# Patient Record
Sex: Male | Born: 1955 | Race: White | Hispanic: No | Smoking: Current every day smoker
Health system: Southern US, Community
[De-identification: ages and names within clinical notes are randomized; demographics above are authoritative.]

## PROBLEM LIST (undated history)

## (undated) DIAGNOSIS — K439 Ventral hernia without obstruction or gangrene: Secondary | ICD-10-CM

---

## 2007-06-24 ENCOUNTER — Ambulatory Visit (HOSPITAL_COMMUNITY): Admission: RE | Admit: 2007-06-24 | Discharge: 2007-06-24 | Payer: Self-pay | Admitting: General Surgery

## 2010-07-24 NOTE — Op Note (Signed)
Juan Logan, Juan Logan                  ACCOUNT NO.:  [id number]   MEDICAL RECORD NO.:  [id number]          PATIENT TYPE:  AMB   LOCATION:  DAY                          FACILITY:  [facility]   PHYSICIAN:  Leonie Man, M.D.   DATE OF BIRTH:  January 14, 1956   DATE OF PROCEDURE:  06/24/2007  DATE OF DISCHARGE:                               OPERATIVE REPORT   PREOPERATIVE DIAGNOSIS:  Incarcerated epigastric ventral hernia.   POSTOPERATIVE DIAGNOSIS:  Incarcerated epigastric ventral hernia.   PROCEDURE:  Repair of epigastric ventral hernia.   SURGEON:  Leonie Man, M.D.   ASSISTANT:  OR tech.   ANESTHESIA:  General.   The patient is a 55 year old man presenting with a painful incarcerated  epigastric hernia just superior to the umbilicus.  He comes to the  operating room now for repair after the risks and potential benefits of  surgery have been discussed.  All questions answered, consent obtained.   Following the induction of satisfactory general anesthesia, the patient  is positioned supinely and the abdomen is prepped and draped to be  included in a sterile operative field.  Positive identification of the  patient as Juan Logan and the procedure to be done as repair of  epigastric ventral hernia was done.  I made a midline incision extending  from the edge of the umbilicus superiorly into the epigastrium,  deepening this through skin, subcutaneous tissue and dissecting down to  the hernia sac.  The hernia sac is grasped and dissected free on all  sides and the incarcerated round ligament is then isolated, clamped and  suture ligated with 2-0 silk.  The edges of the hernia sac are cleared.  This being a relatively small defect, approximately 2.5 cm to 3 in  length, I elected to close this primarily in a transverse fashion using  0 Novofil sutures.  Interrupted 0 Novofil placed to completely close the  hernial defect.  All areas of dissection checked for hemostasis and  noted to be dry.   Sponge, instrument and sharp counts were verified.  Subcutaneous tissues closed with interrupted 2-0 Vicryl sutures.  The  skin closed with running 4-0 Monocryl suture reinforced with Steri-  Strips.  Sterile dressings applied.  The anesthetic reversed and the  patient removed from the operating room to the recovery room in stable  condition.  He tolerated the procedure well.      Leonie Man, M.D.  Electronically Signed     PB/MEDQ  D:  06/24/2007  T:  06/24/2007  Job:  [id number]

## 2010-12-04 LAB — PROTIME-INR
INR: 1
Prothrombin Time: 13.4

## 2010-12-04 LAB — CBC
Hemoglobin: 14.7
MCHC: 32.7
MCV: 89.6
RBC: 5.02
RDW: 13.1

## 2010-12-04 LAB — DIFFERENTIAL
Lymphocytes Relative: 40
Lymphs Abs: 2.1
Neutro Abs: 2.6
Neutrophils Relative %: 48

## 2010-12-04 LAB — COMPREHENSIVE METABOLIC PANEL
CO2: 29
Calcium: 9.1
Creatinine, Ser: 0.95
GFR calc non Af Amer: >60
Glucose, Bld: 105 — ABNORMAL HIGH

## 2012-03-31 ENCOUNTER — Other Ambulatory Visit: Payer: Self-pay | Admitting: Ophthalmology

## 2012-04-04 LAB — WOUND AEROBIC CULTURE

## 2012-04-21 LAB — CULTURE, FUNGUS WITHOUT SMEAR

## 2019-10-15 ENCOUNTER — Encounter (HOSPITAL_COMMUNITY): Payer: Self-pay | Admitting: Neurology

## 2019-10-15 ENCOUNTER — Other Ambulatory Visit: Payer: Self-pay

## 2019-10-15 ENCOUNTER — Inpatient Hospital Stay (HOSPITAL_COMMUNITY)
Admission: EM | Admit: 2019-10-15 | Discharge: 2019-10-17 | DRG: 023 | Disposition: A | Discharge status: Home / Self Care | Payer: Medicare PPO | Attending: Neurology | Admitting: Neurology

## 2019-10-15 ENCOUNTER — Emergency Department (HOSPITAL_COMMUNITY): Payer: Medicare PPO | Admitting: Certified Registered Nurse Anesthetist

## 2019-10-15 ENCOUNTER — Encounter (HOSPITAL_COMMUNITY)
Admission: EM | Disposition: A | Discharge status: Home / Self Care | Payer: Self-pay | Source: Home / Self Care | Attending: Neurology

## 2019-10-15 ENCOUNTER — Inpatient Hospital Stay (HOSPITAL_COMMUNITY): Payer: Medicare PPO

## 2019-10-15 ENCOUNTER — Emergency Department (HOSPITAL_COMMUNITY): Payer: Medicare PPO

## 2019-10-15 DIAGNOSIS — F17200 Nicotine dependence, unspecified, uncomplicated: Secondary | ICD-10-CM | POA: Diagnosis not present

## 2019-10-15 DIAGNOSIS — I10 Essential (primary) hypertension: Secondary | ICD-10-CM | POA: Diagnosis present

## 2019-10-15 DIAGNOSIS — F17210 Nicotine dependence, cigarettes, uncomplicated: Secondary | ICD-10-CM | POA: Diagnosis present

## 2019-10-15 DIAGNOSIS — R4701 Aphasia: Secondary | ICD-10-CM | POA: Diagnosis present

## 2019-10-15 DIAGNOSIS — I63512 Cerebral infarction due to unspecified occlusion or stenosis of left middle cerebral artery: Secondary | ICD-10-CM | POA: Diagnosis present

## 2019-10-15 DIAGNOSIS — I609 Nontraumatic subarachnoid hemorrhage, unspecified: Secondary | ICD-10-CM | POA: Diagnosis present

## 2019-10-15 DIAGNOSIS — K439 Ventral hernia without obstruction or gangrene: Secondary | ICD-10-CM | POA: Diagnosis present

## 2019-10-15 DIAGNOSIS — E876 Hypokalemia: Secondary | ICD-10-CM | POA: Diagnosis present

## 2019-10-15 DIAGNOSIS — E785 Hyperlipidemia, unspecified: Secondary | ICD-10-CM | POA: Diagnosis present

## 2019-10-15 DIAGNOSIS — I6522 Occlusion and stenosis of left carotid artery: Secondary | ICD-10-CM | POA: Diagnosis present

## 2019-10-15 DIAGNOSIS — I639 Cerebral infarction, unspecified: Secondary | ICD-10-CM | POA: Diagnosis present

## 2019-10-15 DIAGNOSIS — E7800 Pure hypercholesterolemia, unspecified: Secondary | ICD-10-CM | POA: Diagnosis not present

## 2019-10-15 DIAGNOSIS — R29706 NIHSS score 6: Secondary | ICD-10-CM | POA: Diagnosis present

## 2019-10-15 DIAGNOSIS — Z79899 Other long term (current) drug therapy: Secondary | ICD-10-CM

## 2019-10-15 DIAGNOSIS — Z20822 Contact with and (suspected) exposure to covid-19: Secondary | ICD-10-CM | POA: Diagnosis present

## 2019-10-15 DIAGNOSIS — Z8249 Family history of ischemic heart disease and other diseases of the circulatory system: Secondary | ICD-10-CM | POA: Diagnosis not present

## 2019-10-15 DIAGNOSIS — I6389 Other cerebral infarction: Secondary | ICD-10-CM | POA: Diagnosis not present

## 2019-10-15 DIAGNOSIS — I7771 Dissection of carotid artery: Secondary | ICD-10-CM | POA: Diagnosis not present

## 2019-10-15 DIAGNOSIS — Z8673 Personal history of transient ischemic attack (TIA), and cerebral infarction without residual deficits: Secondary | ICD-10-CM | POA: Diagnosis present

## 2019-10-15 DIAGNOSIS — I63232 Cerebral infarction due to unspecified occlusion or stenosis of left carotid arteries: Secondary | ICD-10-CM | POA: Diagnosis not present

## 2019-10-15 HISTORY — PX: RADIOLOGY WITH ANESTHESIA: SHX6223

## 2019-10-15 HISTORY — PX: IR CT HEAD LTD: IMG2386

## 2019-10-15 HISTORY — PX: IR INTRAVSC STENT CERV CAROTID W/EMB-PROT MOD SED INCL ANGIO: IMG2303

## 2019-10-15 HISTORY — PX: IR PERCUTANEOUS ART THROMBECTOMY/INFUSION INTRACRANIAL INC DIAG ANGIO: IMG6087

## 2019-10-15 HISTORY — DX: Ventral hernia without obstruction or gangrene: K43.9

## 2019-10-15 LAB — COMPREHENSIVE METABOLIC PANEL
ALT: 13 U/L (ref 0–44)
AST: 15 U/L (ref 15–41)
Albumin: 3.5 g/dL (ref 3.5–5.0)
Alkaline Phosphatase: 66 U/L (ref 38–126)
Anion gap: 11 (ref 5–15)
BUN: 8 mg/dL (ref 8–23)
CO2: 22 mmol/L (ref 22–32)
Calcium: 9 mg/dL (ref 8.9–10.3)
Chloride: 106 mmol/L (ref 98–111)
Creatinine, Ser: 1.09 mg/dL (ref 0.61–1.24)
GFR calc Af Amer: >60 mL/min (ref >60)
GFR calc non Af Amer: >60 mL/min (ref >60)
Glucose, Bld: 135 mg/dL — ABNORMAL HIGH (ref 70–99)
Potassium: 3.9 mmol/L (ref 3.5–5.1)
Sodium: 139 mmol/L (ref 135–145)
Total Bilirubin: 0.5 mg/dL (ref 0.3–1.2)
Total Protein: 7.4 g/dL (ref 6.5–8.1)

## 2019-10-15 LAB — I-STAT CHEM 8, ED
BUN: 8 mg/dL (ref 8–23)
Calcium, Ion: 1.1 mmol/L — ABNORMAL LOW (ref 1.15–1.40)
Chloride: 104 mmol/L (ref 98–111)
Creatinine, Ser: 1 mg/dL (ref 0.61–1.24)
Glucose, Bld: 133 mg/dL — ABNORMAL HIGH (ref 70–99)
HCT: 46 % (ref 39.0–52.0)
Hemoglobin: 15.6 g/dL (ref 13.0–17.0)
Potassium: 3.7 mmol/L (ref 3.5–5.1)
Sodium: 141 mmol/L (ref 135–145)
TCO2: 21 mmol/L — ABNORMAL LOW (ref 22–32)

## 2019-10-15 LAB — PROTIME-INR
INR: 1.1 (ref 0.8–1.2)
Prothrombin Time: 13.5 seconds (ref 11.4–15.2)

## 2019-10-15 LAB — APTT: aPTT: 37 seconds — ABNORMAL HIGH (ref 24–36)

## 2019-10-15 LAB — URINALYSIS, ROUTINE W REFLEX MICROSCOPIC
Bilirubin Urine: NEGATIVE
Glucose, UA: NEGATIVE mg/dL
Hgb urine dipstick: NEGATIVE
Ketones, ur: NEGATIVE mg/dL
Leukocytes,Ua: NEGATIVE
Nitrite: NEGATIVE
Protein, ur: NEGATIVE mg/dL
Specific Gravity, Urine: 1.041 — ABNORMAL HIGH (ref 1.005–1.030)
pH: 6 (ref 5.0–8.0)

## 2019-10-15 LAB — DIFFERENTIAL
Abs Immature Granulocytes: 0.02 10*3/uL (ref 0.00–0.07)
Basophils Absolute: 0.1 10*3/uL (ref 0.0–0.1)
Basophils Relative: 1 %
Eosinophils Absolute: 0.1 10*3/uL (ref 0.0–0.5)
Eosinophils Relative: 1 %
Immature Granulocytes: 0 %
Lymphocytes Relative: 25 %
Lymphs Abs: 1.7 10*3/uL (ref 0.7–4.0)
Monocytes Absolute: 0.4 10*3/uL (ref 0.1–1.0)
Monocytes Relative: 6 %
Neutro Abs: 4.3 10*3/uL (ref 1.7–7.7)
Neutrophils Relative %: 67 %

## 2019-10-15 LAB — SARS CORONAVIRUS 2 BY RT PCR (HOSPITAL ORDER, PERFORMED IN ~~LOC~~ HOSPITAL LAB): SARS Coronavirus 2: NEGATIVE

## 2019-10-15 LAB — CBC
HCT: 45.6 % (ref 39.0–52.0)
Hemoglobin: 15.1 g/dL (ref 13.0–17.0)
MCH: 30.3 pg (ref 26.0–34.0)
MCHC: 33.1 g/dL (ref 30.0–36.0)
MCV: 91.6 fL (ref 80.0–100.0)
Platelets: 188 10*3/uL (ref 150–400)
RBC: 4.98 MIL/uL (ref 4.22–5.81)
RDW: 12.6 % (ref 11.5–15.5)
WBC: 6.6 10*3/uL (ref 4.0–10.5)
nRBC: 0 % (ref 0.0–0.2)

## 2019-10-15 LAB — ETHANOL: Alcohol, Ethyl (B): <10 mg/dL (ref <10)

## 2019-10-15 LAB — RAPID URINE DRUG SCREEN, HOSP PERFORMED
Amphetamines: NOT DETECTED
Barbiturates: NOT DETECTED
Benzodiazepines: NOT DETECTED
Cocaine: NOT DETECTED
Opiates: NOT DETECTED
Tetrahydrocannabinol: NOT DETECTED

## 2019-10-15 LAB — MRSA PCR SCREENING: MRSA by PCR: POSITIVE — AB

## 2019-10-15 SURGERY — IR WITH ANESTHESIA
Anesthesia: General

## 2019-10-15 MED ORDER — IOHEXOL 300 MG/ML  SOLN: 50.0000 mL | Freq: Once | INTRAMUSCULAR | Status: DC | PRN

## 2019-10-15 MED ORDER — ACETAMINOPHEN 160 MG/5ML PO SOLN: 650.0000 mg | ORAL | Status: DC | PRN

## 2019-10-15 MED ORDER — MUPIROCIN 2 % EX OINT
1.0000 "application " | TOPICAL_OINTMENT | Freq: Two times a day (BID) | CUTANEOUS | Status: DC
  Administered 2019-10-16 – 2019-10-17 (×3): 1 via NASAL
  Filled 2019-10-15: qty 22

## 2019-10-15 MED ORDER — VERAPAMIL HCL 2.5 MG/ML IV SOLN
INTRAVENOUS | Status: AC
  Filled 2019-10-15: qty 4

## 2019-10-15 MED ORDER — LACTATED RINGERS IV SOLN: INTRAVENOUS | Status: DC | PRN

## 2019-10-15 MED ORDER — CANGRELOR TETRASODIUM 50 MG IV SOLR
INTRAVENOUS | Status: AC
  Filled 2019-10-15: qty 50

## 2019-10-15 MED ORDER — CHLORHEXIDINE GLUCONATE CLOTH 2 % EX PADS
6.0000 | MEDICATED_PAD | Freq: Every day | CUTANEOUS | Status: DC
  Administered 2019-10-15: 6 via TOPICAL

## 2019-10-15 MED ORDER — ASPIRIN EC 325 MG PO TBEC
325.0000 mg | DELAYED_RELEASE_TABLET | Freq: Once | ORAL | Status: AC
  Administered 2019-10-15: 325 mg via ORAL
  Filled 2019-10-15: qty 1

## 2019-10-15 MED ORDER — EPTIFIBATIDE 20 MG/10ML IV SOLN
INTRAVENOUS | Status: AC
  Filled 2019-10-15: qty 10

## 2019-10-15 MED ORDER — ACETAMINOPHEN 325 MG PO TABS
650.0000 mg | ORAL_TABLET | ORAL | Status: DC | PRN
  Administered 2019-10-15: 650 mg via ORAL
  Filled 2019-10-15: qty 2

## 2019-10-15 MED ORDER — ACETAMINOPHEN 650 MG RE SUPP: 650.0000 mg | RECTAL | Status: DC | PRN

## 2019-10-15 MED ORDER — SUGAMMADEX SODIUM 200 MG/2ML IV SOLN
INTRAVENOUS | Status: DC | PRN
  Administered 2019-10-15: 100 mg via INTRAVENOUS
  Administered 2019-10-15: 200 mg via INTRAVENOUS

## 2019-10-15 MED ORDER — SODIUM CHLORIDE 0.9 % IV SOLN: INTRAVENOUS | Status: DC

## 2019-10-15 MED ORDER — CLEVIDIPINE BUTYRATE 0.5 MG/ML IV EMUL
0.0000 mg/h | INTRAVENOUS | Status: DC
  Administered 2019-10-15: 1 mg/h via INTRAVENOUS
  Administered 2019-10-16: 2 mg/h via INTRAVENOUS
  Administered 2019-10-16: 1 mg/h via INTRAVENOUS
  Filled 2019-10-15 (×3): qty 50

## 2019-10-15 MED ORDER — ESMOLOL HCL 100 MG/10ML IV SOLN
INTRAVENOUS | Status: DC | PRN
Start: 2019-10-15 — End: 2019-10-15
  Administered 2019-10-15: 30 mg via INTRAVENOUS
  Administered 2019-10-15: 40 mg via INTRAVENOUS
  Administered 2019-10-15: 30 mg via INTRAVENOUS

## 2019-10-15 MED ORDER — TIROFIBAN HCL IN NACL 5-0.9 MG/100ML-% IV SOLN
INTRAVENOUS | Status: AC
  Filled 2019-10-15: qty 100

## 2019-10-15 MED ORDER — IOHEXOL 350 MG/ML SOLN
100.0000 mL | Freq: Once | INTRAVENOUS | Status: AC | PRN
  Administered 2019-10-15: 100 mL via INTRAVENOUS

## 2019-10-15 MED ORDER — LIDOCAINE 2% (20 MG/ML) 5 ML SYRINGE
INTRAMUSCULAR | Status: DC | PRN
  Administered 2019-10-15: 100 mg via INTRAVENOUS

## 2019-10-15 MED ORDER — ASPIRIN 81 MG PO CHEW
CHEWABLE_TABLET | ORAL | Status: AC
  Filled 2019-10-15: qty 1

## 2019-10-15 MED ORDER — IOHEXOL 240 MG/ML SOLN
INTRAMUSCULAR | Status: AC
  Filled 2019-10-15: qty 200

## 2019-10-15 MED ORDER — CANGRELOR BOLUS VIA INFUSION
INTRAVENOUS | Status: DC | PRN
  Administered 2019-10-15: 1734 ug via INTRAVENOUS

## 2019-10-15 MED ORDER — CLOPIDOGREL BISULFATE 300 MG PO TABS
ORAL_TABLET | ORAL | Status: AC
  Filled 2019-10-15: qty 1

## 2019-10-15 MED ORDER — SENNOSIDES-DOCUSATE SODIUM 8.6-50 MG PO TABS: 1.0000 | ORAL_TABLET | Freq: Every evening | ORAL | Status: DC | PRN

## 2019-10-15 MED ORDER — SODIUM CHLORIDE 0.9 % IV SOLN
INTRAVENOUS | Status: DC | PRN
  Administered 2019-10-15: 2 ug/kg/min via INTRAVENOUS

## 2019-10-15 MED ORDER — CHLORHEXIDINE GLUCONATE CLOTH 2 % EX PADS
6.0000 | MEDICATED_PAD | Freq: Every day | CUTANEOUS | Status: DC
  Administered 2019-10-16: 6 via TOPICAL

## 2019-10-15 MED ORDER — NITROGLYCERIN 1 MG/10 ML FOR IR/CATH LAB
INTRA_ARTERIAL | Status: AC
  Filled 2019-10-15: qty 10

## 2019-10-15 MED ORDER — SUCCINYLCHOLINE CHLORIDE 200 MG/10ML IV SOSY
PREFILLED_SYRINGE | INTRAVENOUS | Status: DC | PRN
  Administered 2019-10-15: 100 mg via INTRAVENOUS

## 2019-10-15 MED ORDER — ONDANSETRON HCL 4 MG/2ML IJ SOLN
INTRAMUSCULAR | Status: DC | PRN
  Administered 2019-10-15: 4 mg via INTRAVENOUS

## 2019-10-15 MED ORDER — ONDANSETRON HCL 4 MG/2ML IJ SOLN
INTRAMUSCULAR | Status: AC
  Administered 2019-10-15: 4 mg
  Filled 2019-10-15: qty 2

## 2019-10-15 MED ORDER — STROKE: EARLY STAGES OF RECOVERY BOOK
Freq: Once | Status: AC
  Administered 2019-10-16: 1
  Filled 2019-10-15: qty 1

## 2019-10-15 MED ORDER — ONDANSETRON HCL 4 MG/2ML IJ SOLN
4.0000 mg | Freq: Four times a day (QID) | INTRAMUSCULAR | Status: DC | PRN
  Administered 2019-10-16: 4 mg via INTRAVENOUS
  Filled 2019-10-15: qty 2

## 2019-10-15 MED ORDER — VERAPAMIL HCL 2.5 MG/ML IV SOLN
INTRAVENOUS | Status: AC
  Filled 2019-10-15: qty 2

## 2019-10-15 MED ORDER — EPHEDRINE SULFATE-NACL 50-0.9 MG/10ML-% IV SOSY
PREFILLED_SYRINGE | INTRAVENOUS | Status: DC | PRN
  Administered 2019-10-15: 5 mg via INTRAVENOUS
  Administered 2019-10-15 (×6): 10 mg via INTRAVENOUS
  Administered 2019-10-15: 5 mg via INTRAVENOUS

## 2019-10-15 MED ORDER — METOCLOPRAMIDE HCL 5 MG/ML IJ SOLN
10.0000 mg | Freq: Once | INTRAMUSCULAR | Status: AC
  Administered 2019-10-15: 10 mg via INTRAVENOUS
  Filled 2019-10-15: qty 2

## 2019-10-15 MED ORDER — ROCURONIUM BROMIDE 10 MG/ML (PF) SYRINGE
PREFILLED_SYRINGE | INTRAVENOUS | Status: DC | PRN
  Administered 2019-10-15: 20 mg via INTRAVENOUS
  Administered 2019-10-15: 60 mg via INTRAVENOUS

## 2019-10-15 MED ORDER — TICAGRELOR 90 MG PO TABS
ORAL_TABLET | ORAL | Status: AC
  Filled 2019-10-15: qty 2

## 2019-10-15 MED ORDER — TICAGRELOR 90 MG PO TABS
180.0000 mg | ORAL_TABLET | Freq: Once | ORAL | Status: AC
  Administered 2019-10-15: 180 mg via ORAL
  Filled 2019-10-15: qty 2

## 2019-10-15 MED ORDER — PROPOFOL 10 MG/ML IV BOLUS
INTRAVENOUS | Status: DC | PRN
  Administered 2019-10-15: 160 mg via INTRAVENOUS
  Administered 2019-10-15: 40 mg via INTRAVENOUS

## 2019-10-15 NOTE — Sedation Documentation (Addendum)
Sheath removed and closed with Perclose 6 fr.  QuikClot applied to groin site with tegaderm.

## 2019-10-15 NOTE — Anesthesia Preprocedure Evaluation (Signed)
Anesthesia Evaluation  Patient identified by MRN, date of birth, ID band Patient awake    Reviewed: Allergy & PrecautionsPreop documentation limited or incomplete due to emergent nature of procedure.  History of Anesthesia Complications Negative for: history of anesthetic complications  Airway Mallampati: II  TM Distance: >3 FB Neck ROM: Full    Dental  (+) Poor Dentition   Pulmonary neg pulmonary ROS,    Pulmonary exam normal        Cardiovascular negative cardio ROS Normal cardiovascular exam     Neuro/Psych CVA    GI/Hepatic   Endo/Other    Renal/GU      Musculoskeletal   Abdominal   Peds  Hematology   Anesthesia Other Findings   Reproductive/Obstetrics                             Anesthesia Physical Anesthesia Plan  ASA: IV and emergent  Anesthesia Plan: General   Post-op Pain Management:    Induction: Intravenous  PONV Risk Score and Plan: 2 and Ondansetron, Dexamethasone, Treatment may vary due to age or medical condition and Midazolam  Airway Management Planned: Oral ETT  Additional Equipment:   Intra-op Plan:   Post-operative Plan: Extubation in OR and Possible Post-op intubation/ventilation  Informed Consent: I have reviewed the patients History and Physical, chart, labs and discussed the procedure including the risks, benefits and alternatives for the proposed anesthesia with the patient or authorized representative who has indicated his/her understanding and acceptance.     Only emergency history available  Plan Discussed with:   Anesthesia Plan Comments:         Anesthesia Quick Evaluation

## 2019-10-15 NOTE — ED Provider Notes (Signed)
[facility] EMERGENCY DEPARTMENT Provider Note   CSN: [id number] Arrival date & time: 10/15/19  1114  LEVEL 5 CAVEAT - ACUITY OF CONDITION  History No chief complaint on file.   Ricci Dirocco Meyn is a 64 y.o. male.  HPI 64 year old male presents as a code stroke.  History is primarily from EMS.  Last seen normal at around 11 PM last night.  It is unclear what time exactly his symptoms started but the patient seems to indicate 3 yes/no questions that he woke up with it.  He has been having trouble speaking and try to call his sister, who spoke to him at around 10:30 AM and noticed his speech change.   Past Medical History:  Diagnosis Date  . Hernia of abdominal wall     There are no problems to display for this patient.        Family History  Problem Relation Age of Onset  . Hypertension Mother   . Hypertension Father     Social History   Tobacco Use  . Smoking status: Not on file  Substance Use Topics  . Alcohol use: Not on file  . Drug use: Not on file    Home Medications Prior to Admission medications   Not on File    Allergies    Patient has no allergy information on record.  Review of Systems   Review of Systems  Unable to perform ROS: Acuity of condition    Physical Exam Updated Vital Signs There were no vitals taken for this visit.  Physical Exam Vitals and nursing note reviewed.  Constitutional:      Appearance: He is well-developed.  HENT:     Head: Normocephalic and atraumatic.     Right Ear: External ear normal.     Left Ear: External ear normal.     Nose: Nose normal.  Eyes:     General:        Right eye: No discharge.        Left eye: No discharge.  Cardiovascular:     Rate and Rhythm: Normal rate and regular rhythm.     Heart sounds: Normal heart sounds.  Pulmonary:     Effort: Pulmonary effort is normal.     Breath sounds: Normal breath sounds.  Abdominal:     Palpations: Abdomen is soft.     Tenderness: There  is no abdominal tenderness.  Musculoskeletal:     Cervical back: Neck supple.  Skin:    General: Skin is warm and dry.  Neurological:     Mental Status: He is alert.     Comments: Awake, alert, seems to be able to hear but cannot respond appropriately. One time said "it is bad" but otherwise does not have clear speech. Shakes head yes and no. 5/5 strength in all 4 extremities  Psychiatric:        Mood and Affect: Mood is not anxious.     ED Results / Procedures / Treatments   Labs (all labs ordered are listed, but only abnormal results are displayed) Labs Reviewed  APTT - Abnormal; Notable for the following components:      Result Value   aPTT 37 (*)    All other components within normal limits  I-STAT CHEM 8, ED - Abnormal; Notable for the following components:   Glucose, Bld 133 (*)    Calcium, Ion 1.10 (*)    TCO2 21 (*)    All other components within normal limits  SARS CORONAVIRUS 2 BY RT PCR (HOSPITAL ORDER, PERFORMED IN [facility])  PROTIME-INR  CBC  DIFFERENTIAL  ETHANOL  COMPREHENSIVE METABOLIC PANEL  RAPID URINE DRUG SCREEN, HOSP PERFORMED  URINALYSIS, ROUTINE W REFLEX MICROSCOPIC    EKG None  Radiology CT HEAD CODE STROKE WO CONTRAST  Result Date: 10/15/2019 CLINICAL DATA:  Code stroke.  Aphasia, hemianopia. EXAM: CT HEAD WITHOUT CONTRAST TECHNIQUE: Contiguous axial images were obtained from the base of the skull through the vertex without intravenous contrast. COMPARISON:  No pertinent prior exams are available for comparison. FINDINGS: Brain: Mild generalized parenchymal atrophy. There is subtle loss of gray-white differentiation within the left insula consistent with acute ischemic infarction. There is no acute intracranial hemorrhage. No extra-axial fluid collection. No evidence of intracranial mass. No midline shift. Vascular: Asymmetric hyperdensity of the M1 and possibly proximal M2 left MCA branch vessels suspicious for endoluminal thrombus  Skull: Normal. Negative for fracture or focal lesion. Sinuses/Orbits: Visualized orbits show no acute finding. Mild ethmoid sinus mucosal thickening. No significant mastoid effusion ASPECTS (Alberta Stroke Program Early CT Score) - Ganglionic level infarction (caudate, lentiform nuclei, internal capsule, insula, M1-M3 cortex): 6 - Supraganglionic infarction (M4-M6 cortex): 3 Total score (0-10 with 10 being normal): 9 These results were called by telephone at the time of interpretation on 10/15/2019 at 11:30 am to provider Dr. Amada Jupiter, who verbally acknowledged these results. IMPRESSION: Changes of acute ischemic infarction within the left insula. ASPECTS 9. Asymmetric hyperdensity of the left M1 and possibly proximal M2 MCA branch vessels suspicious for endoluminal thrombus. Correlate with findings on pending CTA head/neck. Mild generalized parenchymal atrophy. Electronically Signed   By: Jackey Loge DO   On: 10/15/2019 11:38    Procedures .Critical Care Performed by: Pricilla Loveless, MD Authorized by: Pricilla Loveless, MD   Critical care provider statement:    Critical care time (minutes):  30   Critical care time was exclusive of:  Separately billable procedures and treating other patients   Critical care was necessary to treat or prevent imminent or life-threatening deterioration of the following conditions:  CNS failure or compromise   Critical care was time spent personally by me on the following activities:  Discussions with consultants, evaluation of patient's response to treatment, examination of patient, ordering and performing treatments and interventions, ordering and review of laboratory studies, ordering and review of radiographic studies, pulse oximetry, re-evaluation of patient's condition, obtaining history from patient or surrogate and review of old charts   (including critical care time)  Medications Ordered in ED Medications  iohexol (OMNIPAQUE) 240 MG/ML injection (has no  administration in time range)  aspirin 81 MG chewable tablet (has no administration in time range)  verapamil (ISOPTIN) 2.5 MG/ML injection (has no administration in time range)  ticagrelor (BRILINTA) 90 MG tablet (has no administration in time range)  clopidogrel (PLAVIX) 300 MG tablet (has no administration in time range)  nitroGLYCERIN 100 mcg/mL intra-arterial injection (has no administration in time range)  tirofiban (AGGRASTAT) 5-0.9 MG/100ML-% injection (has no administration in time range)  eptifibatide (INTEGRILIN) 20 MG/10ML injection (has no administration in time range)  verapamil (ISOPTIN) 2.5 MG/ML injection (has no administration in time range)  iohexol (OMNIPAQUE) 350 MG/ML injection 100 mL (100 mLs Intravenous Contrast Given 10/15/19 1136)    ED Course  I have reviewed the triage vital signs and the nursing notes.  Pertinent labs & imaging results that were available during my care of the patient were reviewed by me and considered in  my medical decision making (see chart for details).    MDM Rules/Calculators/A&P                          Patient is protecting his airway on arrival.  Neurology confirmed with sister that he is outside of the TPA window.  However his CT angiography does show acute occlusion and he will need to go emergently to IR.  Went from the CT scanner there. Final Clinical Impression(s) / ED Diagnoses Final diagnoses:  Acute ischemic stroke [facility])    Rx / DC Orders ED Discharge Orders    None       Pricilla Loveless, MD 10/15/19 1158

## 2019-10-15 NOTE — Transfer of Care (Signed)
Immediate Anesthesia Transfer of Care Note  Patient: Juan Logan  Procedure(s) Performed: IR WITH ANESTHESIA (N/A )  Patient Location: ICU  Anesthesia Type:General  Level of Consciousness: awake, alert  and patient cooperative  Airway & Oxygen Therapy: Patient Spontanous Breathing and Patient connected to face mask oxygen  Post-op Assessment: Report given to RN and Post -op Vital signs reviewed and stable  Post vital signs: Reviewed and stable  Last Vitals:  Vitals Value Taken Time  BP    Temp    Pulse 89 10/15/19 1401  Resp 21 10/15/19 1401  SpO2 100 % 10/15/19 1401  Vitals shown include unvalidated device data.  Last Pain: There were no vitals filed for this visit.       Complications: No complications documented.

## 2019-10-15 NOTE — Progress Notes (Signed)
PT Cancellation Note  Patient Details Name: Juan Logan MRN: [id number] DOB: Mar 16, 1955   Cancelled Treatment:    Reason Eval/Treat Not Completed: Active bedrest order. PT will follow up when pt is off bedrest and appropriate to mobilize.   Arlyss Gandy 10/15/2019, 4:09 PM

## 2019-10-15 NOTE — Anesthesia Procedure Notes (Addendum)
Procedure Name: Intubation Date/Time: 10/15/2019 11:59 AM Performed by: Janace Litten, CRNA Pre-anesthesia Checklist: Patient identified, Emergency Drugs available, Suction available and Patient being monitored Patient Re-evaluated:Patient Re-evaluated prior to induction Oxygen Delivery Method: Circle System Utilized Preoxygenation: Pre-oxygenation with 100% oxygen Induction Type: IV induction, Rapid sequence and Cricoid Pressure applied Laryngoscope Size: Mac and 4 Grade View: Grade I Tube type: Oral Tube size: 7.5 mm Number of attempts: 1 Airway Equipment and Method: Stylet Placement Confirmation: ETT inserted through vocal cords under direct vision,  breath sounds checked- equal and bilateral and CO2 detector Secured at: 23 cm Tube secured with: Tape Dental Injury: Teeth and Oropharynx as per pre-operative assessment

## 2019-10-15 NOTE — H&P (Signed)
Neurology H&P   CC: Aphasia  History is obtained from: Sister  HPI: Juan Logan is a 64 y.o. male with no past medical history.  Patient was spoken to by his sister at 3 PM on 10/14/2019 at which time he spoke normally.  At 10:30 AM this morning he had called his sister, she noted that he was not speaking correctly.  He was not answering questions fully but only with 1 word answers.  She called EMS to his house in which they found him aphasic.  Patient was immediately brought to [facility] as code stroke.  On arrival patient was noted to have a dense hemianopsia on the right along with a dense aphasia.  CT of head showed asymmetric hyperdensity of the M1 and possible proximal M2 MCA branch vessel suspicious for endoluminal thrombus.  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  At that time a CTA of head and neck were obtained and cerebral perfusion which showed salvageable area.  Patient was brought immediately back to IR.  LKW: 2300 hrs. on 10/14/2019 tpa given?: no, out of window Premorbid modified Rankin scale (mRS): 0  NIHSS 1a Level of Conscious.: 0 1b LOC Questions: 2 1c LOC Commands: 0 2 Best Gaze: 0 3 Visual: 2 4 Facial Palsy: 0 5a Motor Arm - left: 0 5b Motor Arm - Right: 0 6a Motor Leg - Left:0  6b Motor Leg - Right: 0 7 Limb Ataxia: 0 8 Sensory: 0 9 Best Language: 2 10 Dysarthria: 0 11 Extinct. and Inatten.: 0 TOTAL: 6   Past Medical History:  Diagnosis Date  . Hernia of abdominal wall     Family History  Problem Relation Age of Onset  . Hypertension Mother   . Hypertension Father      Social History:   has no history on file for tobacco use, alcohol use, and drug use.  Medications  Current Facility-Administered Medications:  .  aspirin 81 MG chewable tablet, , , ,  .  clopidogrel (PLAVIX) 300 MG tablet, , , ,  .  eptifibatide (INTEGRILIN) 20 MG/10ML injection, , , ,  .  iohexol (OMNIPAQUE) 240 MG/ML injection, , , ,   .  nitroGLYCERIN 100 mcg/mL intra-arterial injection, , , ,  .  ticagrelor (BRILINTA) 90 MG tablet, , , ,  .  tirofiban (AGGRASTAT) 5-0.9 MG/100ML-% injection, , , ,  .  verapamil (ISOPTIN) 2.5 MG/ML injection, , , ,  No current outpatient medications on file.  ROS:   Unable to obtain due to altered mental status.   Exam: Current vital signs: There were no vitals taken for this visit. Vital signs in last 24 hours:     Constitutional: Appears well-developed and well-nourished.  Eyes: No scleral injection HENT: No OP obstrucion Head: Normocephalic.  Cardiovascular: RRR Respiratory: Effort normal, non-labored breathing GI: Soft.  No distension. There is no tenderness.  Skin: WDI  Neuro: Mental Status: Patient is awake, has dense aphasia but is able to nod to answers, follows visual commands.   Cranial Nerves: II: Dense right hemianopsia III,IV, VI: EOMI without ptosis or diploplia. Pupils equal, round and reactive to light V: Facial sensation is symmetric to temperature VII: Facial movement is symmetric.  VIII: hearing is intact to voice X: Palat elevates symmetrically XI: Shoulder shrug is symmetric. XII: tongue is midline without atrophy or fasciculations.  Motor: Tone is normal. Bulk is normal. 5/5 strength was present in all four extremities.  No drift  Sensory: Sensation is symmetric to light touch and temperature in the arms and legs. Deep Tendon Reflexes: 2+ symmetric upper extremities with 1+ symmetric knee jerk Plantars: Toes are downgoing bilaterally.  Cerebellar: FNF and HKS are intact bilaterally  Labs I have reviewed labs in epic and the results pertinent to this consultation are:   CBC    Component Value Date/Time   WBC 6.6 10/15/2019 1118   RBC 4.98 10/15/2019 1118   HGB 15.6 10/15/2019 1124   HCT 46.0 10/15/2019 1124   PLT 188 10/15/2019 1118   MCV 91.6 10/15/2019 1118   MCH 30.3 10/15/2019 1118   MCHC 33.1 10/15/2019 1118   RDW 12.6  10/15/2019 1118   LYMPHSABS 1.7 10/15/2019 1118   MONOABS 0.4 10/15/2019 1118   EOSABS 0.1 10/15/2019 1118   BASOSABS 0.1 10/15/2019 1118    CMP     Component Value Date/Time   NA 141 10/15/2019 1124   K 3.7 10/15/2019 1124   CL 104 10/15/2019 1124   CO2 29 06/24/2007 0920   GLUCOSE 133 (H) 10/15/2019 1124   BUN 8 10/15/2019 1124   CREATININE 1.00 10/15/2019 1124   CALCIUM 9.1 06/24/2007 0920   PROT 7.0 06/24/2007 0920   ALBUMIN 3.6 06/24/2007 0920   AST 19 06/24/2007 0920   ALT 23 06/24/2007 0920   ALKPHOS 62 06/24/2007 0920   BILITOT 0.8 06/24/2007 0920   GFRNONAA >60 06/24/2007 0920   GFRAA  06/24/2007 0920    >60        The eGFR has been calculated using the MDRD equation. This calculation has not been validated in all clinical    Imaging I have reviewed the images obtained:  CT-scan of the brain-changes of acute ischemic infarct within the left insula.  Aspects 9.  Asymmetric hyperdensity of the left M1 and possibly proximal M2 MCA branch vessel suspicious for endoluminal thrombus  CTA head neck along with perfusion - tandem MCA and ICA occlusion, stroke with large penumbra.    Etta Quill PA-C [facility] [phone]  M-F  (9:00 am- 5:00 PM)  10/15/2019, 11:47 AM     Assessment: This is a 64 year old male who was last known normal at 2300 hrs. on 8/5.  Patient called sister this morning at 1030 and was noted to have abnormality and speech and expression.  Patient was brought immediately to [facility] where it was noted that he had a dense aphasia and dense hemianopsia.  He has a moderate core infarct, but with a much larger area of penumbra, he was taken for emergent thrombectomy.   Plan:  Acuity: Acute -Admit to: ICU -cangrelor x 4 hours, then load with ASA+brilinta if no bleed -Start Atrovent statin 80 mg daily -Blood pressure control, goal per interventional radiology after intervention -MRI/ECHO/A1C/Lipid panel. -Close neuro  monitoring - Statin for goal LDL < 70 -Gentle hydration 75 cc/h normal saline   Prophylaxis DVT: SCD GI: Protonix Bowel: Senokot  Diet: NPO until cleared by speech   This patient is critically ill and at significant risk of neurological worsening, death and care requires constant monitoring of vital signs, hemodynamics,respiratory and cardiac monitoring, neurological assessment, discussion with family, other specialists and medical decision making of high complexity. I spent 60 minutes of neurocritical care time  in the care of  this patient. This was time spent independent of any time provided by nurse practitioner or PA.  Roland Rack, MD [facility] [phone]  If 7pm- 7am, please page neurology on call as  listed in [place]. 10/15/2019  4:07 PM

## 2019-10-15 NOTE — Anesthesia Postprocedure Evaluation (Signed)
Anesthesia Post Note  Patient: Juan Logan  Procedure(s) Performed: IR WITH ANESTHESIA (N/A )     Anesthesia Type: General Level of consciousness: awake and alert Pain management: pain level controlled Vital Signs Assessment: post-procedure vital signs reviewed and stable Respiratory status: spontaneous breathing, nonlabored ventilation, respiratory function stable and patient connected to nasal cannula oxygen Cardiovascular status: blood pressure returned to baseline and stable Postop Assessment: no apparent nausea or vomiting Anesthetic complications: no   No complications documented.  Last Vitals:  Vitals:   10/15/19 1130 10/15/19 1400  BP: (!) 144/69   Pulse: 73   Temp:  (!) 35.8 C    Last Pain:  Vitals:   10/15/19 1400  TempSrc: Oral                 Lucretia Kern

## 2019-10-15 NOTE — Procedures (Signed)
INTERVENTIONAL NEURORADIOLOGY BRIEF POSTPROCEDURE NOTE  DIAGNOSTIC CEREBRAL ANGIOGRAM AND MECHANICAL THROMBECTOMY  Attending: Dr. Baldemar Lenis  Assistant: None  Diagnosis: Left ICA occlusion; left M1 occlusion.  Access site: RCFA  Access closure: Perclose proglide  Anesthesia: General anesthesia.  Medication used: refer to anesthesia documentation.  Complications: None.  Estimated blood loss: 100 mL  Specimen: None  Findings: Occlusion of the left ICA at the bulb. Occlusion of the proximal left M1/MCA. Mechanical thrombectomy performed with a 6 mm solitaire with complete recanalization. Angioplasty performed at the bulb with improvement of flow. Flat panel head CT showed no evidence of hemorrhagic complication.   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 Second angioplasty performed followed by placement of a carotid stent with proximal flow arrest and distal cerebral protection device. Resolution of the stenosis seen post stenting. No evidence of distal embolus on follow-up cerebral angiogram. No evidence of in stent platelet aggregation on follow-up cervical angiogram.    The patient tolerated the procedure well without incident or complication and is in stable condition.   PLAN: - Continued cangrelor infusion - Head CT at 16:00. If no bleed, load brilinta and ASA and stop cangrelor. - Bed rest x6 hours

## 2019-10-16 ENCOUNTER — Inpatient Hospital Stay (HOSPITAL_COMMUNITY): Payer: Medicare PPO

## 2019-10-16 DIAGNOSIS — I10 Essential (primary) hypertension: Secondary | ICD-10-CM

## 2019-10-16 DIAGNOSIS — I609 Nontraumatic subarachnoid hemorrhage, unspecified: Secondary | ICD-10-CM

## 2019-10-16 DIAGNOSIS — I639 Cerebral infarction, unspecified: Secondary | ICD-10-CM

## 2019-10-16 DIAGNOSIS — I63232 Cerebral infarction due to unspecified occlusion or stenosis of left carotid arteries: Secondary | ICD-10-CM

## 2019-10-16 DIAGNOSIS — I6522 Occlusion and stenosis of left carotid artery: Secondary | ICD-10-CM

## 2019-10-16 DIAGNOSIS — E7800 Pure hypercholesterolemia, unspecified: Secondary | ICD-10-CM

## 2019-10-16 DIAGNOSIS — I6389 Other cerebral infarction: Secondary | ICD-10-CM

## 2019-10-16 LAB — LIPID PANEL
Cholesterol: 164 mg/dL (ref 0–200)
HDL: 25 mg/dL — ABNORMAL LOW (ref >40)
LDL Cholesterol: 123 mg/dL — ABNORMAL HIGH (ref 0–99)
Total CHOL/HDL Ratio: 6.6 RATIO
Triglycerides: 80 mg/dL (ref <150)
VLDL: 16 mg/dL (ref 0–40)

## 2019-10-16 LAB — ECHOCARDIOGRAM COMPLETE
AR max vel: 2.99 cm2
AV Area VTI: 2.85 cm2
AV Area mean vel: 3.13 cm2
AV Mean grad: 4 mmHg
AV Peak grad: 8.5 mmHg
Ao pk vel: 1.46 m/s
Area-P 1/2: 3.85 cm2
P 1/2 time: 733 msec
S' Lateral: 3.6 cm
Weight: 4077.63 oz

## 2019-10-16 LAB — BASIC METABOLIC PANEL
Anion gap: 8 (ref 5–15)
BUN: <5 mg/dL — ABNORMAL LOW (ref 8–23)
CO2: 24 mmol/L (ref 22–32)
Calcium: 8.8 mg/dL — ABNORMAL LOW (ref 8.9–10.3)
Chloride: 105 mmol/L (ref 98–111)
Creatinine, Ser: 0.84 mg/dL (ref 0.61–1.24)
GFR calc Af Amer: >60 mL/min (ref >60)
GFR calc non Af Amer: >60 mL/min (ref >60)
Glucose, Bld: 110 mg/dL — ABNORMAL HIGH (ref 70–99)
Potassium: 3.3 mmol/L — ABNORMAL LOW (ref 3.5–5.1)
Sodium: 137 mmol/L (ref 135–145)

## 2019-10-16 LAB — CBC
HCT: 42.5 % (ref 39.0–52.0)
Hemoglobin: 14.6 g/dL (ref 13.0–17.0)
MCH: 31.1 pg (ref 26.0–34.0)
MCHC: 34.4 g/dL (ref 30.0–36.0)
MCV: 90.6 fL (ref 80.0–100.0)
Platelets: 189 10*3/uL (ref 150–400)
RBC: 4.69 MIL/uL (ref 4.22–5.81)
RDW: 12.5 % (ref 11.5–15.5)
WBC: 9.8 10*3/uL (ref 4.0–10.5)
nRBC: 0 % (ref 0.0–0.2)

## 2019-10-16 LAB — HEMOGLOBIN A1C
Hgb A1c MFr Bld: 5.2 % (ref 4.8–5.6)
Mean Plasma Glucose: 102.54 mg/dL

## 2019-10-16 LAB — HIV ANTIBODY (ROUTINE TESTING W REFLEX): HIV Screen 4th Generation wRfx: NONREACTIVE

## 2019-10-16 MED ORDER — ASPIRIN EC 81 MG PO TBEC
81.0000 mg | DELAYED_RELEASE_TABLET | Freq: Every day | ORAL | Status: DC
  Administered 2019-10-16 – 2019-10-17 (×2): 81 mg via ORAL
  Filled 2019-10-16 (×2): qty 1

## 2019-10-16 MED ORDER — ATORVASTATIN CALCIUM 40 MG PO TABS
40.0000 mg | ORAL_TABLET | Freq: Every day | ORAL | Status: DC
  Administered 2019-10-16 – 2019-10-17 (×2): 40 mg via ORAL
  Filled 2019-10-16 (×2): qty 1

## 2019-10-16 MED ORDER — TICAGRELOR 90 MG PO TABS
90.0000 mg | ORAL_TABLET | Freq: Two times a day (BID) | ORAL | Status: DC
  Administered 2019-10-16 – 2019-10-17 (×3): 90 mg via ORAL
  Filled 2019-10-16 (×3): qty 1

## 2019-10-16 MED ORDER — LABETALOL HCL 5 MG/ML IV SOLN
5.0000 mg | INTRAVENOUS | Status: DC | PRN
  Administered 2019-10-16: 10 mg via INTRAVENOUS
  Filled 2019-10-16: qty 4

## 2019-10-16 MED ORDER — POTASSIUM CHLORIDE CRYS ER 20 MEQ PO TBCR
40.0000 meq | EXTENDED_RELEASE_TABLET | ORAL | Status: AC
  Administered 2019-10-16 (×2): 40 meq via ORAL
  Filled 2019-10-16 (×2): qty 2

## 2019-10-16 NOTE — Evaluation (Addendum)
Speech Language Pathology Evaluation Patient Details Name: Juan Logan MRN: [id number] DOB: 08/11/1955 Today's Date: 10/16/2019 Time: 1002-1030 SLP Time Calculation (min) (ACUTE ONLY): 28 min  Problem List:  Patient Active Problem List   Diagnosis Date Noted  . Stroke [facility]) 10/15/2019   Past Medical History:  Past Medical History:  Diagnosis Date  . Hernia of abdominal wall    HPI:   64 y.o. male with no past medical history.  Patient was spoken to by his sister at 23 PM on 10/14/2019 at which time he spoke normally.  At 10:30 AM on 10/15/19 he had called his sister and she noted that he was not speaking correctly.  He was not answering questions fully but only with 1 word answers.  She called EMS to his house in which they found him aphasic.  Patient was immediately brought to [facility] as code stroke.  On arrival patient was noted to have a dense hemianopsia on the right along with a dense aphasia.  CT of head showed asymmetric hyperdensity of the M1 and possible proximal M2 MCA branch vessel suspicious for endoluminal thrombus.  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  At that time a CTA of head and neck were obtained and cerebral perfusion which showed salvageable area   Assessment / Plan / Recommendation Clinical Impression  Pt presents with expressive/receptive aphasia characterized by semantic/phonemic paraphasias and perseverations within simple phrases/sentences and limited simple conversation. Pt was only able to orient to self, but this could be d/t level of aphasia noted in speaking tasks.  Cognition was difficult to fully assess d/t expressive aphasia; pt completed 2-step commands with 50% accuracy, but was unable to complete >2-step commands; he answered yes/no questions with 90% accuracy re: personal information; reading/writing not fully assessed d/t pt refusal as glasses were not available; he was able to read larger print (ie: time on the  clock, environmental signs) with improved accuracy.  Repetition tasks were limited to word-simple phrases with 60% accuracy. Memory affected as pt only able to recall 1/5 words immediately and 0/5 after a delay.  Overall decreased processing of information suspected.  ST will f/u for aphasia tx and continued cognitive assessment/reading/writing when glasses are available.  ST will f/u while  In acute setting.      SLP Assessment  SLP Recommendation/Assessment: Patient needs continued Speech Language Pathology Services SLP Visit Diagnosis: Aphasia (R47.01);Cognitive communication deficit (R41.841)    Follow Up Recommendations  Other (comment) (TBD)    Frequency and Duration min 2x/week  1 week      SLP Evaluation Cognition  Overall Cognitive Status: Impaired/Different from baseline Arousal/Alertness: Awake/alert Orientation Level: Oriented to person;Other (comment) (aphasia impacts responses) Attention:  (DTA) Memory: Impaired Memory Impairment: Retrieval deficit Awareness:  (DTA; appears he is aware of word retrieval deficit) Safety/Judgment:  (DTA)       Comprehension  Auditory Comprehension Overall Auditory Comprehension: Impaired Yes/No Questions: Within Functional Limits Commands: Impaired Two Step Basic Commands: 25-49% accurate Conversation: Simple Interfering Components: Working Radio broadcast assistant: Scientist, clinical (histocompatibility and immunogenetics) Discrimination:  (Able to read environmental signs; no glasses available) Reading Comprehension Reading Status: Unable to assess (comment) (glasses unavailable; read time/environmental signs min)    Expression Expression Primary Mode of Expression: Verbal Verbal Expression Overall Verbal Expression: Impaired Initiation: Impaired Level of Generative/Spontaneous Verbalization: Sentence Repetition: Impaired Level of Impairment: Sentence level Naming:  Impairment Responsive: 26-50% accurate Confrontation: Impaired Convergent: 25-49% accurate Divergent: 25-49% accurate  Other Naming Comments: semantic/phonemic paraphasia, perseverations Verbal Errors: Semantic paraphasias;Phonemic paraphasias;Perseveration Pragmatics: Unable to assess Effective Techniques: Sentence completion;Phonemic cues Non-Verbal Means of Communication: Not applicable Written Expression Dominant Hand: Right Written Expression:  (Refused d/t not having glasses)   Oral / Motor  Oral Motor/Sensory Function Overall Oral Motor/Sensory Function: Mild impairment Facial ROM: Within Functional Limits Facial Symmetry: Abnormal symmetry right;Other (Comment) (slight) Facial Strength: Within Functional Limits Facial Sensation: Within Functional Limits Lingual ROM: Within Functional Limits Lingual Symmetry: Abnormal symmetry right;Other (Comment) (slight) Lingual Strength: Within Functional Limits Lingual Sensation: Within Functional Limits Motor Speech Overall Motor Speech: Appears within functional limits for tasks assessed Respiration: Within functional limits Phonation: Normal Resonance: Within functional limits Articulation: Within functional limitis Intelligibility: Intelligible Motor Planning: Within functional limits Motor Speech Errors: Not applicable                      Tressie Stalker, M.S., CCC-SLP 10/16/2019, 11:10 AM

## 2019-10-16 NOTE — Evaluation (Signed)
Physical Therapy Evaluation Patient Details Name: Juan Logan MRN: [id number] DOB: 15-Jul-1955 Today's Date: 10/16/2019   History of Present Illness  64 y.o. male admitted on 10/15/19 for aphasia.  Pt outside of widow for tPA but was able to go to IR for thrombectomy of L M1 occlusion.  Pt with significant PMH of hernia.  Clinical Impression  Pt is very mobile with only very mild deviations in gait.  His most significant deficits are in cognition and speech.  He would benefit from OP PT assessment to test higher level balance.  PT to follow acutely and re-assess follow up PT needs every session.  Please do DGI walking balance assessment next session.   PT to follow acutely for deficits listed below.      Follow Up Recommendations Outpatient PT (or none depending on progress)    Equipment Recommendations  None recommended by PT    Recommendations for Other Services       Precautions / Restrictions Precautions Precautions: Other (comment) Precaution Comments: imparied speech and cognition, making safety decreased.       Mobility  Bed Mobility Overal bed mobility: Needs Assistance Bed Mobility: Supine to Sit     Supine to sit: Supervision     General bed mobility comments: supervision for safety  Transfers Overall transfer level: Needs assistance Equipment used: None Transfers: Sit to/from Stand Sit to Stand: Supervision         General transfer comment: supervision for safety  Ambulation/Gait Ambulation/Gait assistance: Min guard;Supervision Gait Distance (Feet): 200 Feet Assistive device: None Gait Pattern/deviations: WFL(Within Functional Limits)     General Gait Details: good speed, no obvious LOB, at times joking, dancing, encouraged to walk normally and be serious for a moment.  No awareness of lines.  OT asked him to do pathfinding activity and tested his cognition while walking.  Some sway to his gait, but no overt LOB even with cognitive multi tasking.    Stairs            Wheelchair Mobility    Modified Rankin (Stroke Patients Only) Modified Rankin (Stroke Patients Only) Pre-Morbid Rankin Score: No symptoms Modified Rankin: Moderately severe disability     Balance Overall balance assessment: Mild deficits observed, not formally tested                                           Pertinent Vitals/Pain Pain Assessment: No/denies pain    Home Living Family/patient expects to be discharged to:: Private residence (plans to go home with one of his two sisters) Living Arrangements: Other relatives (lives with his niece normally) Available Help at Discharge: Family;Available 24 hours/day Type of Home: House Home Access: Level entry     Home Layout: One level Home Equipment: None Additional Comments: above is his niece's home information, but she is away on vacation for the next week, so he is going to go home with one of his sisters.    Prior Function Level of Independence: Independent         Comments: works at a [employer].  wears glasses per sister to read, is at baseline a "jokester", so likes to kid around     Hand Dominance   Dominant Hand: Right    Extremity/Trunk Assessment   Upper Extremity Assessment Upper Extremity Assessment: Defer to OT evaluation    Lower Extremity Assessment Lower Extremity Assessment: Overall  WFL for tasks assessed    Cervical / Trunk Assessment Cervical / Trunk Assessment: Normal  Communication   Communication: Expressive difficulties  Cognition Arousal/Alertness: Awake/alert Behavior During Therapy: Impulsive Overall Cognitive Status: Impaired/Different from baseline Area of Impairment: Orientation;Attention;Memory;Following commands;Safety/judgement;Awareness;Problem solving                 Orientation Level: Disoriented to;Place;Situation Current Attention Level: Selective Memory: Decreased short-term memory Following Commands: Follows  one step commands consistently;Follows one step commands with increased time Safety/Judgement: Decreased awareness of safety;Decreased awareness of deficits Awareness: Emergent   General Comments: Pt a bit impulsive, decreased safety awareness, difficulty/frustration with questions due to aphasia, not great at multiple choice questions.        General Comments      Exercises     Assessment/Plan    PT Assessment Patient needs continued PT services  PT Problem List Decreased balance;Decreased safety awareness;Decreased knowledge of precautions       PT Treatment Interventions DME instruction;Gait training;Stair training;Functional mobility training;Therapeutic activities;Therapeutic exercise;Balance training;Neuromuscular re-education;Cognitive remediation;Patient/family education    PT Goals (Current goals can be found in the Care Plan section)  Acute Rehab PT Goals Patient Stated Goal: to get his speech better  PT Goal Formulation: With patient/family Time For Goal Achievement: 10/30/19 Potential to Achieve Goals: Good    Frequency Min 4X/week   Barriers to discharge        Co-evaluation PT/OT/SLP Co-Evaluation/Treatment: Yes Reason for Co-Treatment: Complexity of the patient's impairments (multi-system involvement);Necessary to address cognition/behavior during functional activity;For patient/therapist safety;To address functional/ADL transfers PT goals addressed during session: Mobility/safety with mobility;Balance;Strengthening/ROM         AM-PAC PT "6 Clicks" Mobility  Outcome Measure Help needed turning from your back to your side while in a flat bed without using bedrails?: None Help needed moving from lying on your back to sitting on the side of a flat bed without using bedrails?: None Help needed moving to and from a bed to a chair (including a wheelchair)?: None Help needed standing up from a chair using your arms (e.g., wheelchair or bedside chair)?:  None Help needed to walk in hospital room?: A Little Help needed climbing 3-5 steps with a railing? : A Little 6 Click Score: 22    End of Session   Activity Tolerance: Patient tolerated treatment well Patient left: in chair;with call bell/phone within reach;with chair alarm set;with family/visitor present Nurse Communication: Mobility status PT Visit Diagnosis: Difficulty in walking, not elsewhere classified (R26.2)    Time: 9672-8979 PT Time Calculation (min) (ACUTE ONLY): 31 min   Charges:   PT Evaluation $PT Eval Moderate Complexity: 1 Mod          Corinna Capra, PT, DPT  Acute Rehabilitation [phone] pager [phone]) [phone] office

## 2019-10-16 NOTE — Progress Notes (Signed)
Patient had one episode of hematemesis of 200cc. Dr Aroor with neurology notified and advised to hold Asprin and Brilinta in the am and order CBC for am. PRN Zofran given. RN to continue to monitor.

## 2019-10-16 NOTE — Progress Notes (Signed)
STROKE TEAM PROGRESS NOTE   INTERVAL HISTORY His sister and son are at the bedside.  Patient sitting in bed, awake alert, interactive, following all simple commands.  However still has frequent paraphasic errors.  Overnight nausea vomiting x2, with blood-tinged vomitus.  Aspirin Brilinta currently on hold.  This morning, patient no nausea, seem to have more appetite, will resume aspirin Brilinta and put in diet orders.  MRI and MRA pending.  OBJECTIVE Vitals:   10/16/19 0945 10/16/19 0950 10/16/19 0955 10/16/19 1000  BP: 131/70 139/73 133/76 129/79  Pulse: 77 81 77 80  Resp: 19 (!) 21 (!) 25 20  Temp:      TempSrc:      SpO2: 97% 97% 97% 98%  Weight:        CBC:  Recent Labs  Lab 10/15/19 1118 10/15/19 1118 10/15/19 1124 10/16/19 0547  WBC 6.6  --   --  9.8  NEUTROABS 4.3  --   --   --   HGB 15.1   < > 15.6 14.6  HCT 45.6   < > 46.0 42.5  MCV 91.6  --   --  90.6  PLT 188  --   --  189   < > = values in this interval not displayed.    Basic Metabolic Panel:  Recent Labs  Lab 10/15/19 1118 10/15/19 1118 10/15/19 1124 10/16/19 0547  NA 139   < > 141 137  K 3.9   < > 3.7 3.3*  CL 106   < > 104 105  CO2 22  --   --  24  GLUCOSE 135*   < > 133* 110*  BUN 8   < > 8 <5*  CREATININE 1.09   < > 1.00 0.84  CALCIUM 9.0  --   --  8.8*   < > = values in this interval not displayed.    Lipid Panel:     Component Value Date/Time   CHOL 164 10/16/2019 0547   TRIG 80 10/16/2019 0547   HDL 25 (L) 10/16/2019 0547   CHOLHDL 6.6 10/16/2019 0547   VLDL 16 10/16/2019 0547   LDLCALC 123 (H) 10/16/2019 0547   HgbA1c:  Lab Results  Component Value Date   HGBA1C 5.2 10/16/2019   Urine Drug Screen:     Component Value Date/Time   LABOPIA NONE DETECTED 10/15/2019 1427   COCAINSCRNUR NONE DETECTED 10/15/2019 1427   LABBENZ NONE DETECTED 10/15/2019 1427   AMPHETMU NONE DETECTED 10/15/2019 1427   THCU NONE DETECTED 10/15/2019 1427   LABBARB NONE DETECTED 10/15/2019 1427     Alcohol Level     Component Value Date/Time   ETH <10 10/15/2019 1118    IMAGING  CT HEAD CODE STROKE WO CONTRAST 10/15/2019 IMPRESSION:  Changes of acute ischemic infarction within the left insula. ASPECTS 9. Asymmetric hyperdensity of the left M1 and possibly proximal M2 MCA branch vessels suspicious for endoluminal thrombus. Correlate with findings on pending CTA head/neck. Mild generalized parenchymal atrophy.   CT HEAD WO CONTRAST 10/15/2019   IMPRESSION:  Scattered subarachnoid hemorrhage overlying left cerebral hemisphere, most notably along the anterolateral left frontal lobe and left temporal occipital lobes. Possible subtle acute ischemic infarction changes affecting the left insula were better appreciated on the prior head CT. No interval cortical infarct is identified.  CT CEREBRAL PERFUSION W CONTRAST CT ANGIO HEAD CODE STROKE CT ANGIO NECK CODE STROKE 10/15/2019 IMPRESSION:  CTA neck:  1. Occlusion of the cervical left internal carotid artery  shortly beyond its origin. The left ICA remains occluded throughout the remainder of the neck.  2. The right common and internal carotid arteries are patent within the neck without significant stenosis.  3. The vertebral arteries are patent within the neck bilaterally. Moderate/severe atherosclerotic narrowing at both vertebral artery origins.  4. Peripheral prominent of the interstitial lung markings within the imaged lung apices suspicious for possible interstitial lung disease. Nonemergent high resolution chest CT is recommended for further evaluation when feasible.  CTA head:  1. The left ICA remains occluded throughout the pre-cavernous siphon region. There is reconstitution of flow within the left ICA at the cavernous/paraclinoid level. However, there is endoluminal thrombus with severe stenosis within the supraclinoid left ICA, left ICA terminus and throughout much of the M1 left middle cerebral artery. There is some reconstitution  of flow within the M2 and more distal MCA branch vessels.  2. Fetal origin left PCA. There is a diminutive and irregular appearance of the P2 left PCA which may reflect the presence of additional endoluminal thrombus and/or high-grade atherosclerotic narrowing.   CT perfusion head:  The perfusion software identifies a 61 mL core infarct within the left MCA vascular territory. The perfusion software identifies a 196 mL region of critically hypoperfused parenchyma within the left MCA and PCA vascular territories utilizing the Tmax>6 seconds threshold. Reported mismatch volume 135 mL.   MRI / MRA Head - pending  DG Chest Port 1 View 10/15/2019 IMPRESSION:  Heterogeneous mid to lower lung opacities could reflect pulmonary edema which appears cardiogenic given vascular congestion and cardiomegaly though neurogenic edema in the setting of stroke or atypical infection (including COVID 19) could present similarly.   INTERVENTIONAL NEURORADIOLOGY  DIAGNOSTIC CEREBRAL ANGIOGRAM AND MECHANICAL THROMBECTOMY Dr. Jerilynn Mages de Melchor Amour Left ICA and Left M1 / MCA occlusions Thrombectomy and carotid stent performed PLAN: - Continued cangrelor infusion - Head CT at 16:00. If no bleed, load brilinta and ASA and stop cangrelor. - Bed rest x6 hours  Transthoracic Echocardiogram  00/00/2021 Pending  Bilateral Lower Extremity Venous Dopplers - pending   PHYSICAL EXAM  Temp:  [98 F (36.7 C)-98.7 F (37.1 C)] 98.7 F (37.1 C) (08/07 1600) Pulse Rate:  [53-93] 69 (08/07 1900) Resp:  [8-32] 15 (08/07 1700) BP: (102-163)/(44-90) 163/73 (08/07 1900) SpO2:  [95 %-100 %] 99 % (08/07 1900)  General - Well nourished, well developed, in no apparent distress.  Ophthalmologic - fundi not visualized due to noncooperation.  Cardiovascular - Regular rhythm and rate.  Mental Status -  Level of arousal and orientation to self, age and his son were intact, however not orientated to place, year and his  sister. Language exam showed partial expressive aphasia, frequent paraphasic errors and hesitation of speech.  However able to name and following all simple commands.  Able to repeat simple sentences but not complicated sentences.  Cranial Nerves II - XII - II - Visual field intact OU. III, IV, VI - Extraocular movements intact. V - Facial sensation intact bilaterally. VII - Facial movement intact bilaterally. VIII - Hearing & vestibular intact bilaterally. X - Palate elevates symmetrically. XI - Chin turning & shoulder shrug intact bilaterally. XII - Tongue protrusion intact.  Motor Strength - The patients strength was normal in all extremities and pronator drift was absent.  Bulk was normal and fasciculations were absent.   Motor Tone - Muscle tone was assessed at the neck and appendages and was normal.  Reflexes - The patients reflexes were symmetrical in all  extremities and he had no pathological reflexes.  Sensory - Light touch, temperature/pinprick were assessed and were symmetrical.    Coordination - The patient had normal movements in the hands with no ataxia or dysmetria.  Tremor was absent.  Gait and Station - deferred.   ASSESSMENT/PLAN Mr. Juan Logan is a 64 y.o. male with no significant past medical history presenting with dense hemianopsia on the right along with a dense aphasia. He did not receive IV t-PA due to late presentation (>4.5 hours from time of onset). Left ICA and Left M1 / MCA occlusions -> thrombectomy and left carotid stent.  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  However cardioembolic source cannot be ruled out.  CT Head -  Changes of acute ischemic infarction within the left insula. ASPECTS 9. Asymmetric hyperdensity of the left M1 and possibly proximal M2 MCA branch vessels suspicious  for endoluminal thrombus.   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 There is reconstitution of flow within the left ICA at the cavernous/paraclinoid level. However, there is endoluminal thrombus with severe stenosis within the supraclinoid left ICA, left ICA terminus and throughout much of the M1 left middle cerebral artery. There is some reconstitution of flow within the M2 and more distal MCA branch vessels. Fetal origin left PCA. There is a diminutive and irregular appearance of the P2 left PCA which may reflect the presence of additional endoluminal thrombus and/or high-grade atherosclerotic narrowing.   CT Perfusion - The perfusion software identifies a 61 mL core infarct with reported mismatch volume 135 mL.  CT head post IR - Scattered subarachnoid hemorrhage overlying left cerebral hemisphere   MRI head - pending  MRA head - pending  LE venous doppler - pending  2D Echo - pending  May consider loop recorder versus 30-day monitoring if stroke work-up unrevealing.  Sars Corona Virus 2 - negative  LDL - 123  HgbA1c - 5.2  UDS - negative  VTE prophylaxis - SCDs  No antithrombotic prior to admission, now on aspirin 81 mg daily and Brilinta (ticagrelor) 90 mg bid post stent.  Continue on discharge  Patient counseled to be compliant with his antithrombotic medications  Ongoing aggressive stroke risk factor management  Therapy recommendations:  pending  Disposition:  Pending  Hypertension  Home BP meds: none   Off Cleviprex now  Stable  BP goal < 160 after 24h of IR  Fabry-term BP goal normotensive  Hyperlipidemia  Home Lipid lowering medication: none   LDL 123, goal < 70  On Lipitor 40 mg daily   Continue statin at discharge  Other Stroke Risk Factors  Advanced age  ETOH - not on file  Tobacco - not on file  Obesity, recommend weight loss, diet and exercise as appropriate   Other Active  Problems  Code status - Full code  Hypokalemia - 3.3 - supplemented  Hospital day # 1  This patient is critically ill due to left MCA stroke, left ICA occlusion status post ICA stent, postop SAH and at significant risk of neurological worsening, death form recurrent stroke, hemorrhagic conversion, seizure, hypertensive encephalopathy, carotid stent reocclusion. This patient's care requires constant monitoring of vital signs, hemodynamics, respiratory and cardiac monitoring, review of multiple databases, neurological assessment, discussion with family, other specialists and medical decision  making of high complexity. I spent 35 minutes of neurocritical care time in the care of this patient. I had Bernardini discussion with sister and son at bedside, updated pt current condition, treatment plan and potential prognosis, and answered all the questions.  They expressed understanding and appreciation.    Marvel Plan, MD PhD Stroke Neurology 10/16/2019 8:12 PM   To contact Stroke Continuity provider, please refer to [email or web address]. After hours, contact General Neurology

## 2019-10-16 NOTE — Progress Notes (Signed)
Referring Physician(s): Ritta SlotKirkpatrick, McNeill  Supervising Physician: Baldemar Lenise Macedo Rodrigues, [place]  [facility]  [facility]  Chief Complaint:  Code stroke  Brief History:  Juan Logan is a 64 y.o. male who [facility].    On arrival patient was noted to have a dense hemianopsia on the right along with a dense aphasia.    CT of head showed asymmetric hyperdensity of the M1 and possible proximal M2 MCA branch vessel suspicious for endoluminal thrombus.    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    CTA of head and neck showed cerebral perfusion which showed salvageable area.   He was brought immediately back to IR and Dr. Tommie Samsde Macedo Rodrigues performed a mechanical thrombectomy performed with a 6 mm solitaire with complete recanalization.  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   Second angioplasty performed followed by placement of a carotid stent with proximal flow arrest and distal cerebral protection device. Resolution of the stenosis seen post stenting.   Subjective:  He is sitting up in bed. Feels good this morning. One episode of hematemesis last night so Brilinta and ASA was held.  He's not has anymore episodes.  Allergies: Patient has no known allergies.  Medications: Prior to Admission medications   Medication Sig Start Date End Date Taking? Authorizing Provider  acetaminophen (TYLENOL) 500 MG tablet Take 1,000 mg by mouth every 6 (six) hours as needed for headache (pain).   Yes [provider]     Vital Signs: BP 129/79   Pulse 80   Temp 98.7 F (37.1 C) (Oral)   Resp 20   Wt 115.6 kg   SpO2 98%   Physical Exam Constitutional:      Appearance: Normal appearance.  HENT:     Head: Normocephalic and atraumatic.     Mouth/Throat:     Comments: Tongue midline Eyes:     Extraocular Movements: Extraocular movements intact.     Pupils: Pupils  are equal, round, and reactive to light.  Cardiovascular:     Rate and Rhythm: Normal rate.  Pulmonary:     Effort: Pulmonary effort is normal. No respiratory distress.  Musculoskeletal:     Comments: Common femoral access site on the right looks good. Soft. No bleeding, no hematoma, no pseudoaneurysm.  Skin:    General: Skin is warm and dry.  Neurological:     Mental Status: He is alert and oriented to person, place, and time.  Psychiatric:        Mood and Affect: Mood normal.        Behavior: Behavior normal.        Thought Content: Thought content normal.        Judgment: Judgment normal.     Imaging: CT HEAD WO CONTRAST  Addendum Date: 10/15/2019   ADDENDUM REPORT: 10/15/2019 18:20 ADDENDUM: These results were called by telephone at the time of interpretation on 10/15/2019 at 5:30 pm to provider Dr. Amada JupiterKirkpatrick, who verbally acknowledged these results. Electronically Signed   By: Jackey LogeKyle  Golden DO   On: 10/15/2019 18:20   Result Date: 10/15/2019 CLINICAL DATA:  Stroke, follow-up; status post mechanical thrombectomy and carotid stenting. EXAM: CT HEAD WITHOUT CONTRAST TECHNIQUE: Contiguous axial images were obtained from the base of the skull through the vertex without intravenous contrast. COMPARISON:  Noncontrast head CT and CT angiogram head/neck as well as CT perfusion performed earlier  the same day 10/15/2019 FINDINGS: Brain: There is scattered subarachnoid hemorrhage overlying the left cerebral hemisphere, most notably along the anterolateral left frontal lobe and left temporal occipital lobes. Possible subtle acute ischemic infarction changes affecting the left insula were better appreciated on the prior head CT. No interval demarcated cortical infarct is identified. Stable, mild generalized parenchymal atrophy. No midline shift or hydrocephalus Vascular: Residual circulating contrast material limits evaluation for hyperdense vessels. Atherosclerotic calcifications Skull: Normal. Negative  for fracture or focal lesion. Sinuses/Orbits: Visualized orbits show no acute finding. Mild ethmoid sinus mucosal thickening. No significant mastoid effusion IMPRESSION: Scattered subarachnoid hemorrhage overlying left cerebral hemisphere, most notably along the anterolateral left frontal lobe and left temporal occipital lobes. Possible subtle acute ischemic infarction changes affecting the left insula were better appreciated on the prior head CT. No interval cortical infarct is identified. Electronically Signed: By: Jackey Loge DO On: 10/15/2019 16:53   CT CEREBRAL PERFUSION W CONTRAST  Result Date: 10/15/2019 CLINICAL DATA:  Code stroke. EXAM: CT ANGIOGRAPHY HEAD AND NECK CT PERFUSION BRAIN TECHNIQUE: Multidetector CT imaging of the head and neck was performed using the standard protocol during bolus administration of intravenous contrast. Multiplanar CT image reconstructions and MIPs were obtained to evaluate the vascular anatomy. Carotid stenosis measurements (when applicable) are obtained utilizing NASCET criteria, using the distal internal carotid diameter as the denominator. Multiphase CT imaging of the brain was performed following IV bolus contrast injection. Subsequent parametric perfusion maps were calculated using RAPID software. CONTRAST:  OMNIPAQUE IOHEXOL 350 MG/ML SOLN COMPARISON:  Noncontrast head CT performed immediately prior the same day. FINDINGS: CTA NECK FINDINGS Aortic arch: Standard aortic branching. Minimal calcified plaque within the proximal major branch vessels of the neck. No hemodynamically significant innominate or proximal subclavian artery stenosis. Right carotid system: CCA and ICA patent within the neck without significant stenosis (50% or greater). Mild predominant soft plaque within the carotid bifurcation and proximal ICA. Left carotid system: The CCA is patent within the neck without stenosis. Predominantly soft plaque within the carotid bifurcation and proximal ICA.  There is occlusion of the left ICA shortly beyond its origin and this vessel remains occluded throughout the remainder of the neck. Vertebral arteries: The vertebral arteries are patent within the neck and codominant. Soft plaque results in moderate/severe stenosis at the origin of both vertebral arteries. Skeleton: No acute bony abnormality or aggressive osseous lesion. Cervical spondylosis without high-grade bony spinal canal narrowing. Other neck: No neck mass or cervical lymphadenopathy. Upper chest: No consolidation within the imaged lung apices. Prominence of the peripheral interstitial lung markings suspicious for possible interstitial lung disease. Review of the MIP images confirms the above findings CTA HEAD FINDINGS Anterior circulation: The left ICA remains occluded throughout the pre cavernous siphon region. There is reconstitution of flow within the left ICA at the cavernous/paraclinoid level. There is endoluminal thrombus with severe stenosis within the supraclinoid left ICA, left ICA terminus and extending throughout much of the M1 left middle cerebral artery. There is some reconstitution of flow within the M2 and more distal MCA branch vessels, although asymmetrically diminished as compared to the left. The intracranial right internal carotid artery is patent with only mild nonstenotic plaque. The M1 right middle cerebral artery is patent without significant stenosis. No left M2 proximal branch occlusion or high-grade proximal stenosis is identified. The anterior cerebral arteries are patent. No intracranial aneurysm is identified. Posterior circulation: The intracranial vertebral arteries are patent without significant stenosis, as is the basilar artery. There  is a fetal origin PCA on the left. There is an irregular and diminutive appearance of the P2 left PCA suspicious for the presence of additional endoluminal thrombus or high-grade atherosclerotic stenosis. The right PCA is patent without  significant proximal stenosis Venous sinuses: Within limitations of contrast timing, no convincing thrombus. Anatomic variants: As described Review of the MIP images confirms the above findings CT Brain Perfusion Findings: ASPECTS: Nine CBF (<30%) Volume: 61mL Perfusion (Tmax>6.0s) volume: Mismatch Volume: Infarction Location:Left MCA and PCA vascular territories. These results were called by telephone at the time of interpretation on 10/15/2019 at 11 30 am to provider Dr. Amada Jupiter, who verbally acknowledged these results. IMPRESSION: CTA neck: 1. 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 The left ICA remains occluded throughout the remainder of the neck. 2. The right common and internal carotid arteries are patent within the neck without significant stenosis. 3. The vertebral arteries are patent within the neck bilaterally. Moderate/severe atherosclerotic narrowing at both vertebral artery origins. 4. Peripheral prominent of the interstitial lung markings within the imaged lung apices suspicious for possible interstitial lung disease. Nonemergent high resolution chest CT is recommended for further evaluation when feasible. CTA head: 1. The left ICA remains occluded throughout the pre-cavernous siphon region. There is reconstitution of flow within the left ICA at the cavernous/paraclinoid level. However, there is endoluminal thrombus with severe stenosis within the supraclinoid left ICA, left ICA terminus and throughout much of the M1 left middle cerebral artery. There is some reconstitution of flow within the M2 and more distal MCA branch vessels. 2. Fetal origin left PCA. There is a diminutive and irregular appearance of the P2 left PCA which may reflect the presence of additional endoluminal thrombus and/or high-grade atherosclerotic narrowing. CT perfusion head: The perfusion software identifies a 61 mL core infarct within the left MCA vascular territory. The  perfusion software identifies a 196 mL region of critically hypoperfused parenchyma within the left MCA and PCA vascular territories utilizing the Tmax>6 seconds threshold. Reported mismatch volume 135 mL. Electronically Signed   By: Jackey Loge DO   On: 10/15/2019 11:57   DG Chest Port 1 View  Result Date: 10/15/2019 CLINICAL DATA:  Stroke EXAM: PORTABLE CHEST 1 VIEW COMPARISON:  Radiograph 03/15/2013 FINDINGS: There are diffuse heterogeneous mixed airspace and interstitial opacities with a mid to lower lung predominance with pulmonary vascular congestion and cardiomegaly. The aorta is calcified. The remaining cardiomediastinal contours are unremarkable. No acute osseous or soft tissue abnormality. Telemetry leads overlie the chest. IMPRESSION: Heterogeneous mid to lower lung opacities could reflect pulmonary edema which appears cardiogenic given vascular congestion and cardiomegaly though neurogenic edema in the setting of stroke or atypical infection (including COVID 19) could present similarly. Electronically Signed   By: Kreg Shropshire M.D.   On: 10/15/2019 19:45   CT HEAD CODE STROKE WO CONTRAST  Result Date: 10/15/2019 CLINICAL DATA:  Code stroke.  Aphasia, hemianopia. EXAM: CT HEAD WITHOUT CONTRAST TECHNIQUE: Contiguous axial images were obtained from the base of the skull through the vertex without intravenous contrast. COMPARISON:  No pertinent prior exams are available for comparison. FINDINGS: Brain: Mild generalized parenchymal atrophy. There is subtle loss of gray-white differentiation within the left insula consistent with acute ischemic infarction. There is no acute intracranial hemorrhage. No extra-axial fluid collection. No evidence of intracranial mass. No midline shift. Vascular: Asymmetric hyperdensity of the M1 and possibly proximal M2 left MCA branch vessels suspicious for endoluminal thrombus Skull: Normal. Negative  for fracture or focal lesion. Sinuses/Orbits: Visualized orbits show no  acute finding. Mild ethmoid sinus mucosal thickening. No significant mastoid effusion ASPECTS ([place]ber[place] Stroke Program Early CT Score) - Ganglionic level infarction (caudate, lentiform nuclei, internal capsule, insula, M1-M3 cortex): 6 - Supraganglionic infarction (M4-M6 cortex): 3 Total score (0-10 with 10 being normal): 9 These results were called by telephone at the time of interpretation on 10/15/2019 at 11:30 am to provider Dr. Amada Jupiter, who verbally acknowledged these results. IMPRESSION: Changes of acute ischemic infarction within the left insula. ASPECTS 9. Asymmetric hyperdensity of the left M1 and possibly proximal M2 MCA branch vessels suspicious for endoluminal thrombus. Correlate with findings on pending CTA head/neck. Mild generalized parenchymal atrophy. Electronically Signed   By: Jackey Loge DO   On: 10/15/2019 11:38   CT ANGIO HEAD CODE STROKE  Result Date: 10/15/2019 CLINICAL DATA:  Code stroke. EXAM: CT ANGIOGRAPHY HEAD AND NECK CT PERFUSION BRAIN TECHNIQUE: Multidetector CT imaging of the head and neck was performed using the standard protocol during bolus administration of intravenous contrast. Multiplanar CT image reconstructions and MIPs were obtained to evaluate the vascular anatomy. Carotid stenosis measurements (when applicable) are obtained utilizing NASCET criteria, using the distal internal carotid diameter as the denominator. Multiphase CT imaging of the brain was performed following IV bolus contrast injection. Subsequent parametric perfusion maps were calculated using RAPID software. CONTRAST:  OMNIPAQUE IOHEXOL 350 MG/ML SOLN COMPARISON:  Noncontrast head CT performed immediately prior the same day. FINDINGS: CTA NECK FINDINGS Aortic arch: Standard aortic branching. Minimal calcified plaque within the proximal major branch vessels of the neck. No hemodynamically significant innominate or proximal subclavian artery stenosis. Right carotid system: CCA and ICA patent within  the neck without significant stenosis (50% or greater). Mild predominant soft plaque within the carotid bifurcation and proximal ICA. Left carotid system: The CCA is patent within the neck without stenosis. Predominantly soft plaque within the carotid bifurcation and proximal ICA. There is occlusion of the left ICA shortly beyond its origin and this vessel remains occluded throughout the remainder of the neck. Vertebral arteries: The vertebral arteries are patent within the neck and codominant. Soft plaque results in moderate/severe stenosis at the origin of both vertebral arteries. Skeleton: No acute bony abnormality or aggressive osseous lesion. Cervical spondylosis without high-grade bony spinal canal narrowing. Other neck: No neck mass or cervical lymphadenopathy. Upper chest: No consolidation within the imaged lung apices. Prominence of the peripheral interstitial lung markings suspicious for possible interstitial lung disease. Review of the MIP images confirms the above findings CTA HEAD FINDINGS Anterior circulation: The left ICA remains occluded throughout the pre cavernous siphon region. There is reconstitution of flow within the left ICA at the cavernous/paraclinoid level. There is endoluminal thrombus with severe stenosis within the supraclinoid left ICA, left ICA terminus and extending throughout much of the M1 left middle cerebral artery. There is some reconstitution of flow within the M2 and more distal MCA branch vessels, although asymmetrically diminished as compared to the left. The intracranial right internal carotid artery is patent with only mild nonstenotic plaque. The M1 right middle cerebral artery is patent without significant stenosis. No left M2 proximal branch occlusion or high-grade proximal stenosis is identified. The anterior cerebral arteries are patent. No intracranial aneurysm is identified. Posterior circulation: The intracranial vertebral arteries are patent without significant  stenosis, as is the basilar artery. There is a fetal origin PCA on the left. There is an irregular and diminutive appearance of the P2 left PCA  suspicious for the presence of additional endoluminal thrombus or high-grade atherosclerotic stenosis. The right PCA is patent without significant proximal stenosis Venous sinuses: Within limitations of contrast timing, no convincing thrombus. Anatomic variants: As described Review of the MIP images confirms the above findings CT Brain Perfusion Findings: ASPECTS: Nine CBF (<30%) Volume: 61mL Perfusion (Tmax>6.0s) volume: Mismatch Volume: Infarction Location:Left MCA and PCA vascular territories. These results were called by telephone at the time of interpretation on 10/15/2019 at 11 30 am to provider Dr. Amada Jupiter, who verbally acknowledged these results. IMPRESSION: CTA neck: 1. 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 The left ICA remains occluded throughout the remainder of the neck. 2. The right common and internal carotid arteries are patent within the neck without significant stenosis. 3. The vertebral arteries are patent within the neck bilaterally. Moderate/severe atherosclerotic narrowing at both vertebral artery origins. 4. Peripheral prominent of the interstitial lung markings within the imaged lung apices suspicious for possible interstitial lung disease. Nonemergent high resolution chest CT is recommended for further evaluation when feasible. CTA head: 1. The left ICA remains occluded throughout the pre-cavernous siphon region. There is reconstitution of flow within the left ICA at the cavernous/paraclinoid level. However, there is endoluminal thrombus with severe stenosis within the supraclinoid left ICA, left ICA terminus and throughout much of the M1 left middle cerebral artery. There is some reconstitution of flow within the M2 and more distal MCA branch vessels. 2. Fetal origin left PCA. There is a  diminutive and irregular appearance of the P2 left PCA which may reflect the presence of additional endoluminal thrombus and/or high-grade atherosclerotic narrowing. CT perfusion head: The perfusion software identifies a 61 mL core infarct within the left MCA vascular territory. The perfusion software identifies a 196 mL region of critically hypoperfused parenchyma within the left MCA and PCA vascular territories utilizing the Tmax>6 seconds threshold. Reported mismatch volume 135 mL. Electronically Signed   By: Jackey Loge DO   On: 10/15/2019 11:57   CT ANGIO NECK CODE STROKE  Result Date: 10/15/2019 CLINICAL DATA:  Code stroke. EXAM: CT ANGIOGRAPHY HEAD AND NECK CT PERFUSION BRAIN TECHNIQUE: Multidetector CT imaging of the head and neck was performed using the standard protocol during bolus administration of intravenous contrast. Multiplanar CT image reconstructions and MIPs were obtained to evaluate the vascular anatomy. Carotid stenosis measurements (when applicable) are obtained utilizing NASCET criteria, using the distal internal carotid diameter as the denominator. Multiphase CT imaging of the brain was performed following IV bolus contrast injection. Subsequent parametric perfusion maps were calculated using RAPID software. CONTRAST:  OMNIPAQUE IOHEXOL 350 MG/ML SOLN COMPARISON:  Noncontrast head CT performed immediately prior the same day. FINDINGS: CTA NECK FINDINGS Aortic arch: Standard aortic branching. Minimal calcified plaque within the proximal major branch vessels of the neck. No hemodynamically significant innominate or proximal subclavian artery stenosis. Right carotid system: CCA and ICA patent within the neck without significant stenosis (50% or greater). Mild predominant soft plaque within the carotid bifurcation and proximal ICA. Left carotid system: The CCA is patent within the neck without stenosis. Predominantly soft plaque within the carotid bifurcation and proximal ICA. There is  occlusion of the left ICA shortly beyond its origin and this vessel remains occluded throughout the remainder of the neck. Vertebral arteries: The vertebral arteries are patent within the neck and codominant. Soft plaque results in moderate/severe stenosis at the origin of both vertebral arteries. Skeleton: No acute bony abnormality or aggressive osseous  lesion. Cervical spondylosis without high-grade bony spinal canal narrowing. Other neck: No neck mass or cervical lymphadenopathy. Upper chest: No consolidation within the imaged lung apices. Prominence of the peripheral interstitial lung markings suspicious for possible interstitial lung disease. Review of the MIP images confirms the above findings CTA HEAD FINDINGS Anterior circulation: The left ICA remains occluded throughout the pre cavernous siphon region. There is reconstitution of flow within the left ICA at the cavernous/paraclinoid level. There is endoluminal thrombus with severe stenosis within the supraclinoid left ICA, left ICA terminus and extending throughout much of the M1 left middle cerebral artery. There is some reconstitution of flow within the M2 and more distal MCA branch vessels, although asymmetrically diminished as compared to the left. The intracranial right internal carotid artery is patent with only mild nonstenotic plaque. The M1 right middle cerebral artery is patent without significant stenosis. No left M2 proximal branch occlusion or high-grade proximal stenosis is identified. The anterior cerebral arteries are patent. No intracranial aneurysm is identified. Posterior circulation: The intracranial vertebral arteries are patent without significant stenosis, as is the basilar artery. There is a fetal origin PCA on the left. There is an irregular and diminutive appearance of the P2 left PCA suspicious for the presence of additional endoluminal thrombus or high-grade atherosclerotic stenosis. The right PCA is patent without significant  proximal stenosis Venous sinuses: Within limitations of contrast timing, no convincing thrombus. Anatomic variants: As described Review of the MIP images confirms the above findings CT Brain Perfusion Findings: ASPECTS: Nine CBF (<30%) Volume: 41mL Perfusion (Tmax>6.0s) volume: Mismatch Volume: Infarction Location:Left MCA and PCA vascular territories. These results were called by telephone at the time of interpretation on 10/15/2019 at 11 30 am to provider Dr. Amada Jupiter, who verbally acknowledged these results. IMPRESSION: CTA neck: 1. 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 The left ICA remains occluded throughout the remainder of the neck. 2. The right common and internal carotid arteries are patent within the neck without significant stenosis. 3. The vertebral arteries are patent within the neck bilaterally. Moderate/severe atherosclerotic narrowing at both vertebral artery origins. 4. Peripheral prominent of the interstitial lung markings within the imaged lung apices suspicious for possible interstitial lung disease. Nonemergent high resolution chest CT is recommended for further evaluation when feasible. CTA head: 1. The left ICA remains occluded throughout the pre-cavernous siphon region. There is reconstitution of flow within the left ICA at the cavernous/paraclinoid level. However, there is endoluminal thrombus with severe stenosis within the supraclinoid left ICA, left ICA terminus and throughout much of the M1 left middle cerebral artery. There is some reconstitution of flow within the M2 and more distal MCA branch vessels. 2. Fetal origin left PCA. There is a diminutive and irregular appearance of the P2 left PCA which may reflect the presence of additional endoluminal thrombus and/or high-grade atherosclerotic narrowing. CT perfusion head: The perfusion software identifies a 61 mL core infarct within the left MCA vascular territory. The perfusion  software identifies a 196 mL region of critically hypoperfused parenchyma within the left MCA and PCA vascular territories utilizing the Tmax>6 seconds threshold. Reported mismatch volume 135 mL. Electronically Signed   By: Jackey Loge DO   On: 10/15/2019 11:57    Labs:  CBC: Recent Labs    10/15/19 1118 10/15/19 1124 10/16/19 0547  WBC 6.6  --  9.8  HGB 15.1 15.6 14.6  HCT 45.6 46.0 42.5  PLT 188  --  189  COAGS: Recent Labs    10/15/19 1118  INR 1.1  APTT 37*    BMP: Recent Labs    10/15/19 1118 10/15/19 1124 10/16/19 0547  NA 139 141 137  K 3.9 3.7 3.3*  CL 106 104 105  CO2 22  --  24  GLUCOSE 135* 133* 110*  BUN 8 8 <5*  CALCIUM 9.0  --  8.8*  CREATININE 1.09 1.00 0.84  GFRNONAA >60  --  >60  GFRAA >60  --  >60    LIVER FUNCTION TESTS: Recent Labs    10/15/19 1118  BILITOT 0.5  AST 15  ALT 13  ALKPHOS 66  PROT 7.4  ALBUMIN 3.5    Assessment and Plan:  Code stroke.  S/P Mechanical thrombectomy performed with a 6 mm solitaire with complete recanalization. Angioplasty performed at the bulb with improvement of flow.  Ok to start Brilinta and Aspirin to maintain patency of the stent.  Ok to ambulate.  Will arrange follow up as outpatient to follow stent.  Electronically Signed: Gwynneth Macleod, PA-C 10/16/2019, 10:09 AM    I spent a total of 15 Minutes at the the patient's bedside AND on the patient's hospital floor or unit, greater than 50% of which was counseling/coordinating care for f/u cerebral intervention.

## 2019-10-16 NOTE — Evaluation (Signed)
Occupational Therapy Evaluation Patient Details Name: Juan Logan MRN: [id number] DOB: [birth year] Today's Date: 10/16/2019    History of Present Illness 64 y.o. male admitted on 10/15/19 for aphasia.  Pt outside of widow for tPA but was able to go to IR for thrombectomy of L M1 occlusion.  Pt with significant PMH of hernia.   Clinical Impression   PTA pt living with niece and functioning at independent communtiy level. Pt still drives and works at [employer]. At time of eval, pt presents with ability to complete bed mobility at and sit <> stands at supervision level assist to manage safety and impulsivity. Pt is currently completing BADL at supervision level as well. The majority of pts deficits are in cognition and language. Pt shows difficulty both receptively and expressively. It appears his expressive communication is most impaired. Pt able to name familiar tasks, but increased difficulty when also having to recall a situation/item/scenario. Pt completed functional mobility beyond household distance for trail making task. He had difficulty stating the room numbers out loud. Deficits in topographical orientation also noted, pt needing max cues to locate his room. Pt was able to write his name and birthday on piece of paper with significant increased time and max cueing from OT. Also noted cognitive deficits in attention and safety awareness. Given current status, recommend OP neuro OT to support safety and cognitive retraining to support independent PLOF. OT will continue to follow per POC listed below.     Follow Up Recommendations  Outpatient OT;Other (comment) (neuro for cognition)    Equipment Recommendations  None recommended by OT    Recommendations for Other Services       Precautions / Restrictions Precautions Precautions: Other (comment) Precaution Comments: imparied speech and cognition, making safety decreased.  Restrictions Weight Bearing Restrictions: No       Mobility Bed Mobility Overal bed mobility: Needs Assistance Bed Mobility: Supine to Sit     Supine to sit: Supervision     General bed mobility comments: supervision for safety and to manage impulsivity  Transfers Overall transfer level: Needs assistance Equipment used: None Transfers: Sit to/from Stand Sit to Stand: Supervision         General transfer comment: supervision for safety    Balance Overall balance assessment: Mild deficits observed, not formally tested                                         ADL either performed or assessed with clinical judgement   ADL Overall ADL's : Needs assistance/impaired Eating/Feeding: Set up;Sitting   Grooming: Supervision/safety;Sitting;Cueing for safety;Cueing for sequencing   Upper Body Bathing: Supervision/ safety;Sitting;Cueing for safety;Cueing for sequencing   Lower Body Bathing: Supervison/ safety;Sit to/from stand;Sitting/lateral leans;Cueing for safety;Cueing for sequencing   Upper Body Dressing : Supervision/safety;Sitting;Cueing for sequencing;Cueing for safety   Lower Body Dressing: Supervision/safety;Sit to/from stand;Sitting/lateral leans;Cueing for safety;Cueing for sequencing   Toilet Transfer: Supervision/safety;Ambulation;Regular Toilet;Cueing for safety;Cueing for sequencing   Toileting- Clothing Manipulation and Hygiene: Supervision/safety;Sit to/from stand;Sitting/lateral lean   Tub/ Shower Transfer: Supervision/safety;Ambulation   Functional mobility during ADLs: Supervision/safety;Cueing for safety;Cueing for sequencing General ADL Comments: pt mostly limited by cognitive and language deficits     Vision Baseline Vision/History: Wears glasses Wears Glasses: Reading only Patient Visual Report: No change from baseline Vision Assessment?: No apparent visual deficits Additional Comments: Pt able to scan in all 4 quadrants without  difficulty     Perception     Praxis       Pertinent Vitals/Pain Pain Assessment: No/denies pain     Hand Dominance Right   Extremity/Trunk Assessment Upper Extremity Assessment Upper Extremity Assessment: Overall WFL for tasks assessed   Lower Extremity Assessment Lower Extremity Assessment: Overall WFL for tasks assessed   Cervical / Trunk Assessment Cervical / Trunk Assessment: Normal   Communication Communication Communication: Expressive difficulties   Cognition Arousal/Alertness: Awake/alert Behavior During Therapy: Impulsive Overall Cognitive Status: Impaired/Different from baseline Area of Impairment: Orientation;Attention;Memory;Following commands;Safety/judgement;Awareness;Problem solving                 Orientation Level: Disoriented to;Place;Situation Current Attention Level: Selective Memory: Decreased short-term memory Following Commands: Follows one step commands consistently;Follows one step commands with increased time Safety/Judgement: Decreased awareness of safety;Decreased awareness of deficits Awareness: Emergent Problem Solving: Slow processing;Decreased initiation General Comments: pt overall impulsive with decreased safety and marked decreased awareness of deficits. difficulty to fully assess cognition due to both expressive and receptive difficulties. Pt was able to follow one step commands 50% of the time but became overwhelmed with 2 step commands   General Comments       Exercises     Shoulder Instructions      Home Living Family/patient expects to be discharged to:: Private residence Living Arrangements: Other relatives Available Help at Discharge: Family;Available 24 hours/day Type of Home: House Home Access: Level entry     Home Layout: One level     Bathroom Shower/Tub: Chief Strategy Officer: Standard     Home Equipment: None   Additional Comments: above is his niece's home information, but she is away on vacation for the next week, so he is going  to go home with one of his sisters.      Prior Functioning/Environment Level of Independence: Independent        Comments: works at a [employer].  wears glasses per sister to read, is at baseline a "jokester"        OT Problem List: Decreased knowledge of use of DME or AE;Decreased knowledge of precautions;Decreased activity tolerance;Decreased cognition;Decreased safety awareness      OT Treatment/Interventions: Self-care/ADL training;Patient/family education;Balance training;Neuromuscular education;Therapeutic activities;DME and/or AE instruction;Cognitive remediation/compensation    OT Goals(Current goals can be found in the care plan section) Acute Rehab OT Goals Patient Stated Goal: to get his speech better  OT Goal Formulation: With patient Time For Goal Achievement: 10/30/19 Potential to Achieve Goals: Good  OT Frequency: Min 2X/week   Barriers to D/C:            Co-evaluation PT/OT/SLP Co-Evaluation/Treatment: Yes Reason for Co-Treatment: Complexity of the patient's impairments (multi-system involvement);For patient/therapist safety PT goals addressed during session: Mobility/safety with mobility;Balance;Strengthening/ROM OT goals addressed during session: ADL's and self-care;Strengthening/ROM (cognition)      AM-PAC OT "6 Clicks" Daily Activity     Outcome Measure Help from another person eating meals?: A Little Help from another person taking care of personal grooming?: A Little Help from another person toileting, which includes using toliet, bedpan, or urinal?: A Little Help from another person bathing (including washing, rinsing, drying)?: A Little Help from another person to put on and taking off regular upper body clothing?: A Little Help from another person to put on and taking off regular lower body clothing?: A Little 6 Click Score: 18   End of Session Nurse Communication: Mobility status  Activity Tolerance: Patient tolerated treatment  well Patient  left: in chair;with call bell/phone within reach;with chair alarm set  OT Visit Diagnosis: Cognitive communication deficit (R41.841);Other symptoms and signs involving cognitive function Symptoms and signs involving cognitive functions: Cerebral infarction                Time: 1341-1402 OT Time Calculation (min): 21 min Charges:  OT General Charges $OT Visit: 1 Visit OT Evaluation $OT Eval Moderate Complexity: 1 Mod  Dalphine Handing, MSOT, OTR/L Acute Rehabilitation Services [facility] Office Number: [phone] Pager: [phone]  Dalphine Handing 10/16/2019, 5:18 PM

## 2019-10-16 NOTE — Progress Notes (Signed)
  Echocardiogram 2D Echocardiogram has been performed.  Gerda Diss 10/16/2019, 8:34 AM

## 2019-10-16 NOTE — Plan of Care (Signed)
  Problem: Education: Goal: Knowledge of disease or condition will improve Outcome: Progressing Goal: Knowledge of secondary prevention will improve Outcome: Progressing Goal: Knowledge of patient specific risk factors addressed and post discharge goals established will improve Outcome: Progressing   Problem: Coping: Goal: Will verbalize positive feelings about self Outcome: Progressing Goal: Will identify appropriate support needs Outcome: Progressing   Problem: Self-Care: Goal: Verbalization of feelings and concerns over difficulty with self-care will improve Outcome: Progressing Goal: Ability to communicate needs accurately will improve Outcome: Progressing   Problem: Nutrition: Goal: Dietary intake will improve Outcome: Progressing   Problem: Self-Care: Goal: Ability to participate in self-care as condition permits will improve Outcome: Completed/Met   Problem: Nutrition: Goal: Risk of aspiration will decrease Outcome: Completed/Met

## 2019-10-16 NOTE — Progress Notes (Signed)
Lower extremity venous bilateral study completed.   Preliminary results relayed to RN.  See Cv Proc for preliminary results.   Jean Rosenthal

## 2019-10-16 NOTE — Progress Notes (Signed)
PT Cancellation Note  Patient Details Name: Juan Logan MRN: [id number] DOB: 02/03/1956   Cancelled Treatment:    Reason Eval/Treat Not Completed: Patient at procedure or test/unavailable.  Pt headed to MRI.  PT to check back later today as time allows.   Thanks,  Juan Logan, PT, DPT  Acute Rehabilitation [phone] pager #[phone] office       Juan Logan 10/16/2019, 11:14 AM

## 2019-10-17 DIAGNOSIS — F17200 Nicotine dependence, unspecified, uncomplicated: Secondary | ICD-10-CM

## 2019-10-17 DIAGNOSIS — I7771 Dissection of carotid artery: Secondary | ICD-10-CM

## 2019-10-17 LAB — BASIC METABOLIC PANEL
Anion gap: 11 (ref 5–15)
BUN: 5 mg/dL — ABNORMAL LOW (ref 8–23)
CO2: 21 mmol/L — ABNORMAL LOW (ref 22–32)
Calcium: 9 mg/dL (ref 8.9–10.3)
Chloride: 105 mmol/L (ref 98–111)
Creatinine, Ser: 1.02 mg/dL (ref 0.61–1.24)
GFR calc Af Amer: >60 mL/min (ref >60)
GFR calc non Af Amer: >60 mL/min (ref >60)
Glucose, Bld: 108 mg/dL — ABNORMAL HIGH (ref 70–99)
Potassium: 4.6 mmol/L (ref 3.5–5.1)
Sodium: 137 mmol/L (ref 135–145)

## 2019-10-17 LAB — CBC
HCT: 42.9 % (ref 39.0–52.0)
Hemoglobin: 14.3 g/dL (ref 13.0–17.0)
MCH: 30.3 pg (ref 26.0–34.0)
MCHC: 33.3 g/dL (ref 30.0–36.0)
MCV: 90.9 fL (ref 80.0–100.0)
Platelets: 195 10*3/uL (ref 150–400)
RBC: 4.72 MIL/uL (ref 4.22–5.81)
RDW: 12.4 % (ref 11.5–15.5)
WBC: 9.8 10*3/uL (ref 4.0–10.5)
nRBC: 0 % (ref 0.0–0.2)

## 2019-10-17 MED ORDER — CHLORHEXIDINE GLUCONATE CLOTH 2 % EX PADS: 6.0000 | MEDICATED_PAD | Freq: Every day | CUTANEOUS | Status: DC

## 2019-10-17 MED ORDER — ATORVASTATIN CALCIUM 40 MG PO TABS: 40.0000 mg | ORAL_TABLET | Freq: Every day | ORAL | 1 refills | Status: DC

## 2019-10-17 MED ORDER — ASPIRIN 81 MG PO TBEC: 81.0000 mg | DELAYED_RELEASE_TABLET | Freq: Every day | ORAL | 11 refills | Status: AC

## 2019-10-17 MED ORDER — TICAGRELOR 90 MG PO TABS: 90.0000 mg | ORAL_TABLET | Freq: Two times a day (BID) | ORAL | 2 refills | Status: DC

## 2019-10-17 MED ORDER — MUPIROCIN 2 % EX OINT: 1.0000 "application " | TOPICAL_OINTMENT | Freq: Two times a day (BID) | CUTANEOUS | 0 refills | Status: DC

## 2019-10-17 NOTE — Discharge Summary (Addendum)
Patient ID: Thomasene RippleDanny R Dirusso    l   MRN: [id number]      DOB: 1955-12-04  Date of Admission: 10/15/2019 Date of Discharge: 10/17/2019  Attending Physician:  Marvel PlanXu, Leighanna Kirn, MD, Stroke MD Consultant(s):   Neuro Interventional Radiology - Dr. 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 Hernia of abdominal wall     Family History Family History  Problem Relation Age of Onset  . Hypertension Mother   . Hypertension Father     Social History  has no history on file for tobacco use, alcohol use, and drug use.  Allergies as of 10/17/2019   No Known Allergies     Medication List    TAKE these medications   acetaminophen 500 MG tablet Commonly known as: TYLENOL Take 1,000 mg by mouth every 6 (six) hours as needed for headache (pain).   aspirin 81 MG EC tablet Take 1 tablet (81 mg total) by mouth daily. Swallow whole. Start taking on: October 18, 2019   atorvastatin 40 MG tablet Commonly known as: LIPITOR Take 1 tablet (40 mg total) by mouth daily. Start taking on: October 18, 2019   mupirocin ointment 2 % Commonly known as: BACTROBAN Place 1 application into the nose 2 (two) times daily.   ticagrelor 90 MG Tabs tablet Commonly known as: BRILINTA Take 1 tablet (90 mg total) by mouth 2 (two) times daily.       HOME MEDICATIONS PRIOR TO ADMISSION Medications Prior to Admission  Medication Sig Dispense Refill  . acetaminophen (TYLENOL) 500 MG tablet Take 1,000 mg by mouth every 6 (six) hours as needed for headache (pain).       HOSPITAL MEDICATIONS . aspirin EC  81 mg Oral Daily  . atorvastatin  40 mg Oral Daily  . [START ON 10/18/2019]  Chlorhexidine Gluconate Cloth  6 each Topical Q0600  . mupirocin ointment  1 application Nasal BID  . ticagrelor  90 mg Oral BID    LABORATORY STUDIES CBC    Component Value Date/Time   WBC 9.8 10/17/2019 0417   RBC 4.72 10/17/2019 0417   HGB 14.3 10/17/2019 0417   HCT 42.9 10/17/2019 0417   PLT 195 10/17/2019 0417   MCV 90.9 10/17/2019 0417   MCH 30.3 10/17/2019 0417   MCHC 33.3 10/17/2019 0417   RDW 12.4 10/17/2019 0417   LYMPHSABS 1.7 10/15/2019 1118   MONOABS 0.4 10/15/2019 1118   EOSABS 0.1 10/15/2019 1118   BASOSABS 0.1 10/15/2019 1118   CMP    Component Value Date/Time   NA 137 10/17/2019 0417   K 4.6 10/17/2019 0417   CL 105 10/17/2019 0417   CO2 21 (L) 10/17/2019 0417   GLUCOSE 108 (H) 10/17/2019 0417   BUN 5 (L) 10/17/2019 0417   CREATININE 1.02 10/17/2019 0417   CALCIUM 9.0 10/17/2019 0417   PROT 7.4 10/15/2019 1118   ALBUMIN 3.5 10/15/2019 1118   AST 15 10/15/2019 1118   ALT 13 10/15/2019 1118   ALKPHOS 66 10/15/2019 1118   BILITOT 0.5 10/15/2019 1118   GFRNONAA >60 10/17/2019 0417   GFRAA >60 10/17/2019 0417   COAGS Lab Results  Component  Value Date   INR 1.1 10/15/2019   INR 1.0 06/24/2007   Lipid Panel    Component Value Date/Time   CHOL 164 10/16/2019 0547   TRIG 80 10/16/2019 0547   HDL 25 (L) 10/16/2019 0547   CHOLHDL 6.6 10/16/2019 0547   VLDL 16 10/16/2019 0547   LDLCALC 123 (H) 10/16/2019 0547   HgbA1C  Lab Results  Component Value Date   HGBA1C 5.2 10/16/2019   Urinalysis    Component Value Date/Time   COLORURINE YELLOW 10/15/2019 1431   APPEARANCEUR CLEAR 10/15/2019 1431   LABSPEC 1.041 (H) 10/15/2019 1431   PHURINE 6.0 10/15/2019 1431   GLUCOSEU NEGATIVE 10/15/2019 1431   HGBUR NEGATIVE 10/15/2019 1431   BILIRUBINUR NEGATIVE 10/15/2019 1431   KETONESUR NEGATIVE 10/15/2019 1431   PROTEINUR NEGATIVE 10/15/2019 1431   NITRITE NEGATIVE 10/15/2019 1431   LEUKOCYTESUR NEGATIVE 10/15/2019 1431   Urine Drug Screen      Component Value Date/Time   LABOPIA NONE DETECTED 10/15/2019 1427   COCAINSCRNUR NONE DETECTED 10/15/2019 1427   LABBENZ NONE DETECTED 10/15/2019 1427   AMPHETMU NONE DETECTED 10/15/2019 1427   THCU NONE DETECTED 10/15/2019 1427   LABBARB NONE DETECTED 10/15/2019 1427    Alcohol Level    Component Value Date/Time   ETH <10 10/15/2019 1118     SIGNIFICANT DIAGNOSTIC STUDIES  CT Head Code Stroke WO Contrast 10/15/2019 IMPRESSION:  Changes of acute ischemic infarction within the left insula. ASPECTS 9. Asymmetric hyperdensity of the left M1 and possibly proximal M2 MCA branch vessels suspicious for endoluminal thrombus. Correlate with findings on pending CTA head/neck. Mild generalized parenchymal atrophy.   CT HEAD WO CONTRAST 10/15/2019   IMPRESSION:  Scattered subarachnoid hemorrhage overlying left cerebral hemisphere, most notably along the anterolateral left frontal lobe and left temporal occipital lobes. Possible subtle acute ischemic infarction changes affecting the left insula were better appreciated on the prior head CT. No interval cortical infarct is identified.  CT CEREBRAL PERFUSION W CONTRAST CT ANGIO HEAD CODE STROKE CT ANGIO NECK CODE STROKE 10/15/2019 IMPRESSION:  CTA neck:  1. 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 The left ICA remains occluded throughout the remainder of the neck.  2. The right common and internal carotid arteries are patent within the neck without significant stenosis.  3. The vertebral arteries are patent within the neck bilaterally. Moderate/severe atherosclerotic narrowing at both vertebral artery origins.  4. Peripheral prominent of the interstitial lung markings within the imaged lung apices suspicious for possible interstitial lung disease. Nonemergent high resolution chest CT is recommended for further evaluation when feasible.  CTA head:  1. The left ICA remains occluded throughout the  pre-cavernous siphon region. There is reconstitution of flow within the left ICA at the cavernous/paraclinoid level. However, there is endoluminal thrombus with severe stenosis within the supraclinoid left ICA, left ICA terminus and throughout much of the M1 left middle cerebral artery. There is some reconstitution of flow within the M2 and more distal MCA branch vessels.  2. Fetal origin left PCA. There is a diminutive and irregular appearance of the P2 left PCA which may reflect the presence of additional endoluminal thrombus and/or high-grade atherosclerotic narrowing.   CT perfusion head:  The perfusion software identifies a 61 mL core infarct within the left MCA vascular territory. The perfusion software identifies a 196 mL region of critically hypoperfused parenchyma within the left MCA and PCA vascular territories utilizing the Tmax>6 seconds threshold. Reported mismatch volume 135 mL.  MRI Brain WO Contrast 10/16/19 IMPRESSION:  Fairly extensive changes of acute ischemic infarction within the left MCA vascular territory, most notably affecting the left temporal lobe and posterior left insula. Suspected petechial hemorrhage at these sites. No significant mass effect at this time. No midline shift  Acute infarcts are also present within the left basal ganglia and left thalamus.   Additional scattered tiny acute infarcts within the left cerebral hemisphere within the left MCA vascular territory.  Redemonstrated subarachnoid hemorrhage scattered along the left cerebral hemisphere. No hydrocephalus.Mild paranasal sinus mucosal thickening.  Trace right mastoid effusion.  MRA Head WO Contrast 10/16/19 IMPRESSION: No intracranial large vessel occlusion or proximal high-grade arterial stenosis. Specifically, the revascularized intracranial left ICA and M1 left MCA remain patent.  DG Chest Port 1 View 10/15/2019 IMPRESSION:  Heterogeneous mid to lower lung opacities could reflect pulmonary  edema which appears cardiogenic given vascular congestion and cardiomegaly though neurogenic edema in the setting of stroke or atypical infection (including COVID 19) could present similarly.   INTERVENTIONAL NEURORADIOLOGY  DIAGNOSTIC CEREBRAL ANGIOGRAM AND MECHANICAL THROMBECTOMY Dr. Jerilynn Mages de Melchor Amour Left ICA and Left M1 / MCA occlusions Thrombectomy and carotid stent performed PLAN: - Continued cangrelor infusion - Head CT at 16:00. If no bleed, load brilinta and ASA and stop cangrelor. - Bed rest x6 hours  Transthoracic Echocardiogram  10/16/2019 IMPRESSIONS  1. Left ventricular ejection fraction, by estimation, is 60 to 65%. The  left ventricle has normal function. The left ventricle has no regional  wall motion abnormalities. There is mild left ventricular hypertrophy.  Left ventricular diastolic parameters  were normal.  2. Right ventricular systolic function is normal. The right ventricular  size is normal.  3. Left atrial size was mildly dilated.  4. The mitral valve is normal in structure. No evidence of mitral valve  regurgitation. No evidence of mitral stenosis.  5. The aortic valve is tricuspid. Aortic valve regurgitation is mild. No  aortic stenosis is present.  6. There is borderline dilatation of the aortic root and of the ascending  aorta measuring 39 mm.  7. The inferior vena cava is normal in size with greater than 50%  respiratory variability, suggesting right atrial pressure of 3 mmHg.   Conclusion(s)/Recommendation(s): No intracardiac source of embolism  detected on this transthoracic study. A transesophageal echocardiogram is  recommended to exclude cardiac source of embolism if clinically indicated.   Bilateral Lower Extremity Venous Dopplers  10/16/19 Summary:  RIGHT: There is no evidence of deep vein thrombosis in the lower extremity. No cystic structure found in the popliteal fossa.  LEFT: There is no evidence of deep vein  thrombosis in the lower extremity. No cystic structure found in the popliteal fossa.      HISTORY OF PRESENT ILLNESS (From Dr Alene Mires H&P on 10/15/19)  Raman Featherston Gabler is a 64 y.o. male with no past medical history.  Patient was spoken to by his sister at 81 PM on 10/14/2019 at which time he spoke normally.  At 10:30 AM this morning he had called his sister, she noted that he was not speaking correctly.  He was not answering questions fully but only with 1 word answers.  She called EMS to his house in which they found him aphasic.  Patient was immediately brought to [facility] as code stroke.  On arrival patient was noted to have a dense hemianopsia on the right along with a dense aphasia.  CT of head showed asymmetric hyperdensity of the M1  and possible proximal M2 MCA branch vessel suspicious for endoluminal thrombus.  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  At that time a CTA of head and neck were obtained and cerebral perfusion which showed salvageable area.  Patient was brought immediately back to IR. LKW: 2300 hrs. on 10/14/2019 tpa given?: no, out of window Premorbid modified Rankin scale (mRS): 0   HOSPITAL COURSE Mr. DUDLEY MAGES is a 64 y.o. male with no significant past medical history presenting with dense hemianopsia on the right along with a dense aphasia. He did not receive IV t-PA due to late presentation (>4.5 hours from time of onset). Left ICA and Left M1 / MCA occlusions -> thrombectomy and left carotid stent.  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  However cardioembolic source cannot be ruled out.  CT Head -  Changes of acute ischemic infarction within the left insula. ASPECTS 9. Asymmetric hyperdensity of the left M1 and possibly proximal M2 MCA branch vessels suspicious  for endoluminal thrombus.   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 There is reconstitution of flow within the left ICA at the cavernous/paraclinoid level. However, there is endoluminal thrombus with severe stenosis within the supraclinoid left ICA, left ICA terminus and throughout much of the M1 left middle cerebral artery. There is some reconstitution of flow within the M2 and more distal MCA branch vessels. Fetal origin left PCA. There is a diminutive and irregular appearance of the P2 left PCA which may reflect the presence of additional endoluminal thrombus and/or high-grade atherosclerotic narrowing.   CT Perfusion - The perfusion software identifies a 61 mL core infarct with reported mismatch volume 135 mL.  CT head post IR - Scattered subarachnoid hemorrhage overlying left cerebral hemisphere   MRI Brain 8/7 - Fairly extensive changes of acute ischemic infarction within the left MCA vascular territory. Redemonstrated subarachnoid hemorrhage scattered along the left cerebral hemisphere.   MRA head 8/7 - The revascularized intracranial left ICA and M1 left MCA remain patent.  LE venous doppler - negative for DVT bilaterally  2D Echo - EF 60 - 65%. No cardiac source of emboli identified.   will consider 30-day cardiac monitoring as outpt to rule out afib.  Sars Corona Virus 2 - negative  LDL - 123  HgbA1c - 5.2  UDS - negative  VTE prophylaxis - SCDs  No antithrombotic prior to admission, now on aspirin 81 mg daily and Brilinta (ticagrelor) 90 mg bid post stent.  Continue on discharge  Patient counseled to be compliant with his antithrombotic medications  Ongoing aggressive stroke risk factor management  Therapy recommendations:  Outpatient PT and OT and speech  Disposition:  Discharge to home  Follow up with Dr. Salvadore Dom as outpt in 3 weeks  Hypertension  Home BP meds: none   Off Cleviprex  now  Stable  Ebner-term BP goal normotensive  Hyperlipidemia  Home Lipid lowering medication: none   LDL 123, goal < 70  Lipitor 40 mg daily   Continue statin at discharge  Tobacco abuse  Current smoker, 1PPD  Smoking cessation counseling provided  Pt is willing to quit  Other Stroke Risk Factors  Advanced age  Obesity, recommend weight loss, diet and exercise as appropriate   Other  Active Problems  Code status - Full code  Hypokalemia - 3.3 ->4.6    DISCHARGE EXAM Vitals:   10/17/19 0700 10/17/19 0730 10/17/19 0800 10/17/19 0900  BP: 139/70  (!) 161/68 (!) 149/63  Pulse: 61 (!) 49 (!) 57 70  Resp: (!) 23  Temp:   98.5 F (36.9 C)   TempSrc:   Oral   SpO2: 96% 94% 94% 93%  Weight:       General - Well nourished, well developed, in no apparent distress.  Ophthalmologic - fundi not visualized due to noncooperation.  Cardiovascular - Regular rhythm and rate.  Mental Status -  Level of arousal and orientation to self, place and his son were intact, however not correct on age or time due to aphasia. Language exam showed partial expressive aphasia, frequent paraphasic errors and hesitation of speech.  Able to follow all simple commands.  Able to repeat simple sentences but not complicated sentences. Naming 1/4.   Cranial Nerves II - XII - II - Visual field intact OU. III, IV, VI - Extraocular movements intact. V - Facial sensation intact bilaterally. VII - Facial movement intact bilaterally. VIII - Hearing & vestibular intact bilaterally. X - Palate elevates symmetrically. XI - Chin turning & shoulder shrug intact bilaterally. XII - Tongue protrusion intact.  Motor Strength - The patient's strength was normal in all extremities and pronator drift was absent.  Bulk was normal and fasciculations were absent.   Motor Tone - Muscle tone was assessed at the neck and appendages and was normal.  Reflexes - The patient's reflexes were  symmetrical in all extremities and he had no pathological reflexes.  Sensory - Light touch, temperature/pinprick were assessed and were symmetrical.    Coordination - The patient had normal movements in the hands with no ataxia or dysmetria.  Tremor was absent.  Gait and Station - deferred.   DISCHARGE INSTRUCTIONS GIVEN TO THE PATIENT 1. Please find a primary care provider as soon as possible for regular medical follow up. 2. Gradually increase activity as tolerated 3. It's very important to take your medications as prescribed to prevent another stroke and to keep your stent open.   DISCHARGE DIET   Diet Order            Diet regular Room service appropriate? Yes with Assist; Fluid consistency: Thin  Diet effective now                liquids  DISCHARGE PLAN  Disposition:  Discharge to home  aspirin 81 mg daily and Brilinta (ticagrelor) 90 mg bid for secondary stroke prevention.  Ongoing risk factor control by Primary Care Physician at time of discharge  Outpatient PT, OT and speech therapies will be arranged.  Follow-up System, Pcp Not In in 2 weeks.  Follow-up in [facility] in 4 weeks, office to schedule an appointment.   Follow-up with Dr. Baldemar Lenis in  3 weeks. Call for an appointment.  45 minutes were spent preparing discharge.   Marvel Plan, MD PhD Stroke Neurology 10/17/2019 6:01 PM

## 2019-10-17 NOTE — Discharge Instructions (Signed)
1. Please find a primary care provider as soon as possible for regular medical follow up. 2. Gradually increase activity as tolerated 3. It's very important to take your medications as prescribed to prevent another stroke and to keep your stent open.

## 2019-10-17 NOTE — TOC Transition Note (Signed)
Transition of Care [facility]) - CM/SW Discharge Note   Patient Details  Name: Juan Logan MRN: [id number] Date of Birth: [birth year]  [facility] Care [facility] CM/SW Contact:  Deveron Furlong, RN 10/17/2019, 12:18 PM   Clinical Narrative:    Patient to d/c home with family in [place].  Patient's son at bedside.  Given Brilinta 30 day free card and outpatient referrals for PT, OT, SLP sent to Neuro rehab.  Son states family can drive him to OP therapy appointments.   Patient has no PCP.  [facility] number added to AVS to assist with finding PCP.  Pharmacy is not open today.  Scripts printed per family request.     Final next level of care: OP Rehab Barriers to Discharge: No Barriers Identified

## 2019-10-18 ENCOUNTER — Encounter (HOSPITAL_COMMUNITY): Payer: Self-pay | Admitting: Radiology

## 2019-10-18 ENCOUNTER — Other Ambulatory Visit: Payer: Self-pay | Admitting: Medical

## 2019-10-18 DIAGNOSIS — I639 Cerebral infarction, unspecified: Secondary | ICD-10-CM

## 2019-10-18 HISTORY — DX: Cerebral infarction, unspecified: I63.9

## 2019-10-18 NOTE — Progress Notes (Signed)
   [facility] asked to coordinate an outpatient cardiac monitor to further evaluate etiology of CVA. Order placed. Dr. Antoine Poche (DOD 10/18/19) to read.  Beatriz Stallion, PA-C 10/18/19; 8:22 AM

## 2019-10-19 ENCOUNTER — Encounter: Payer: Self-pay | Admitting: *Deleted

## 2019-10-19 ENCOUNTER — Other Ambulatory Visit (HOSPITAL_COMMUNITY): Payer: Self-pay | Admitting: Neuroradiology

## 2019-10-19 ENCOUNTER — Telehealth (HOSPITAL_COMMUNITY): Payer: Self-pay

## 2019-10-19 DIAGNOSIS — I639 Cerebral infarction, unspecified: Secondary | ICD-10-CM

## 2019-10-19 NOTE — Progress Notes (Signed)
Patient ID: Juan Logan, male   DOB: November 17, 1955, 64 y.o.   MRN: [id number] Patient enrolled for [employer] to ship a 30 day cardiac event monitor to his home. Letter with instructions mailed to patient.

## 2019-10-19 NOTE — Telephone Encounter (Signed)
Returned call to Juan Logan to schedule US carotid, no answer, no vm. AW

## 2019-10-22 ENCOUNTER — Encounter (HOSPITAL_COMMUNITY): Payer: Medicare PPO

## 2019-10-26 ENCOUNTER — Encounter (HOSPITAL_COMMUNITY): Payer: Self-pay | Admitting: Certified Registered Nurse Anesthetist

## 2019-11-01 ENCOUNTER — Ambulatory Visit (INDEPENDENT_AMBULATORY_CARE_PROVIDER_SITE_OTHER): Payer: Medicare PPO

## 2019-11-01 ENCOUNTER — Ambulatory Visit: Payer: Medicare PPO | Admitting: Family Medicine

## 2019-11-01 ENCOUNTER — Other Ambulatory Visit: Payer: Self-pay

## 2019-11-01 ENCOUNTER — Encounter: Payer: Self-pay | Admitting: Family Medicine

## 2019-11-01 VITALS — BP 140/72 | HR 72 | Ht 73.5 in | Wt 247.8 lb

## 2019-11-01 DIAGNOSIS — I639 Cerebral infarction, unspecified: Secondary | ICD-10-CM

## 2019-11-01 DIAGNOSIS — E7800 Pure hypercholesterolemia, unspecified: Secondary | ICD-10-CM | POA: Insufficient documentation

## 2019-11-01 DIAGNOSIS — I4891 Unspecified atrial fibrillation: Secondary | ICD-10-CM | POA: Diagnosis not present

## 2019-11-01 DIAGNOSIS — I63232 Cerebral infarction due to unspecified occlusion or stenosis of left carotid arteries: Secondary | ICD-10-CM | POA: Diagnosis not present

## 2019-11-01 DIAGNOSIS — I69320 Aphasia following cerebral infarction: Secondary | ICD-10-CM

## 2019-11-01 DIAGNOSIS — E669 Obesity, unspecified: Secondary | ICD-10-CM | POA: Insufficient documentation

## 2019-11-01 DIAGNOSIS — R432 Parageusia: Secondary | ICD-10-CM

## 2019-11-01 DIAGNOSIS — F17219 Nicotine dependence, cigarettes, with unspecified nicotine-induced disorders: Secondary | ICD-10-CM | POA: Insufficient documentation

## 2019-11-01 DIAGNOSIS — Z09 Encounter for follow-up examination after completed treatment for conditions other than malignant neoplasm: Secondary | ICD-10-CM | POA: Diagnosis not present

## 2019-11-01 DIAGNOSIS — Z87891 Personal history of nicotine dependence: Secondary | ICD-10-CM | POA: Insufficient documentation

## 2019-11-01 HISTORY — DX: Aphasia following cerebral infarction: I69.320

## 2019-11-01 NOTE — Patient Instructions (Addendum)
  DO NOT DRIVE UNTIL CLEARED BY YOUR NEUROLOGIST.   Start the 30 day heart monitor as recommended.   You should hear from [facility] outpatient Neuro rehab center in the next week or so.

## 2019-11-01 NOTE — Progress Notes (Signed)
Subjective:    Patient ID: Juan Logan, male    DOB: 04-19-1955, 64 y.o.   MRN: [id number]  HPI Chief Complaint  Patient presents with  . new pt    new pt, get established. hospital follow-up due to stroke not being able to remember, doesn't know if he needs PT for helping remember   He is new to the practice and here to establish care. Recent hospitalization for CVA.  Previous medical care: none prior to CVA and admission   His sister is with him today. States other sister is in the lobby. He lives with his niece.  Has been driving and sister wants to know if this is ok.   Presented to the ED on 10/14/2019 with aphasia and right sided weakness. Code stroke initiated and he was found to have an acute ischemic infarct.  He was found to have a left MCA infarct with hemorrhagic conversion due to left ICA occlusion and left MCA thrombus s/p thrombectomy and carotid stenting. Discharged on aspirin 81 mg and Brilinta.   States he is having issues with processing thoughts and speaking. He has been getting frustrated at times.  Sister states patient has memory issues.  Reports loss of taste which is causing him to eat less.   Has appt with Dr. Pearlean Brownie, neurologist on 12/14/2019  Has orders for speech PT, OT, and speech therapy.  Carotid US ordered for 01/20/2020  He is supposed to wear a 30-day cardiac monitor and has it at home. He has not done this yet.  Dr. Antoine Poche will be reading.   HL- has been on atorvastatin 40 mg since hospital stay.    Smoker - he was smoking 1 PPD and stopped cold [place] in the hospital.   Sister states patient had hernia surgery in 2008 or 2009 and had mesh insert. States is does not seem to be holding and he may need to see a Careers adviser again.   Denies fever, chills, headache, dizziness, chest pain, palpitations, shortness of breath, abdominal pain, N/V/D, urinary symptoms, LE edema.  No numbness, tingling or weakness.    Reviewed allergies, medications,  past medical, surgical, family, and social history.    Review of Systems Pertinent positives and negatives in the history of present illness.     Objective:   Physical Exam Constitutional:      General: He is not in acute distress.    Appearance: Normal appearance. He is not ill-appearing.  Eyes:     General: Lids are normal. Vision grossly intact. No visual field deficit.    Extraocular Movements: Extraocular movements intact.     Conjunctiva/sclera: Conjunctivae normal.     Pupils: Pupils are equal, round, and reactive to light.  Cardiovascular:     Rate and Rhythm: Normal rate and regular rhythm.     Pulses: Normal pulses.     Heart sounds: Normal heart sounds.  Pulmonary:     Effort: Pulmonary effort is normal.     Breath sounds: Normal breath sounds.  Musculoskeletal:        General: Normal range of motion.     Cervical back: Normal range of motion and neck supple.  Skin:    General: Skin is warm and dry.     Capillary Refill: Capillary refill takes less than 2 seconds.  Neurological:     Mental Status: He is alert.     Cranial Nerves: No facial asymmetry.     Sensory: Sensation is intact.  Motor: Motor function is intact. No weakness or pronator drift.     Coordination: Romberg sign negative. Coordination normal. Finger-Nose-Finger Test and Heel to [facility] Test normal.     Gait: Gait is intact.  Psychiatric:        Attention and Perception: Attention normal.        Mood and Affect: Mood normal.     Comments: Expressive aphasia     BP 140/72   Pulse 72   Ht 6' 1.5" (1.867 m)   Wt 247 lb 12.8 oz (112.4 kg)   SpO2 98%   BMI 32.25 kg/m       Assessment & Plan:  Cerebrovascular accident (CVA) due to occlusion of left carotid artery (HCC) - Plan: CBC with Differential/Platelet, Comprehensive metabolic panel He is new to the practice and here to establish care.  No regular medical care and recent CVA.  Continue dual antiplatelet therapy as started in the  hospital.  Continue statin therapy.  Follow-up with neurology as scheduled.  He will need to start the 30-day cardiac monitor to look for arrhythmias.  He is aware that he will hear from: Outpatient neuro rehab.  I will check labs and follow-up.  Congratulated him on stopping smoking Discussed that he should not drive until he is cleared by his neurologist to do so.  Aphasia S/P CVA -He is aware that he will here from [facility] health outpatient speech therapy, neuro rehab.  Continue dual platelet therapy and statin therapy.  Recommend that he keep an eye on his blood pressure at home.  Counseling on healthy diet and exercise.  Hospital discharge follow-up - Plan: CBC with Differential/Platelet, Comprehensive metabolic panel -Reviewed inpatient notes, imaging, labs as well as discharge summary.  Medications reviewed.  Pure hypercholesterolemia -Continue statin therapy.  Recommend healthy diet  Former smoker -Congratulated him on stopping smoking while in the hospital.  He does not have a desire to start back currently.  Loss of taste -This is new since CVA.  Obesity (BMI 30-39.9) -Encouraged healthy diet and physical activity  Spent at least 45 minutes face-to-face with patient and his sister.  More than 50% was in counseling and coordination of care.

## 2019-11-02 LAB — COMPREHENSIVE METABOLIC PANEL
ALT: 17 IU/L (ref 0–44)
AST: 17 IU/L (ref 0–40)
Albumin/Globulin Ratio: 1.3 (ref 1.2–2.2)
Albumin: 4.3 g/dL (ref 3.8–4.8)
Alkaline Phosphatase: 82 IU/L (ref 48–121)
BUN/Creatinine Ratio: 9 — ABNORMAL LOW (ref 10–24)
BUN: 9 mg/dL (ref 8–27)
Bilirubin Total: 0.4 mg/dL (ref 0.0–1.2)
CO2: 24 mmol/L (ref 20–29)
Calcium: 9.6 mg/dL (ref 8.6–10.2)
Chloride: 105 mmol/L (ref 96–106)
Creatinine, Ser: 1.01 mg/dL (ref 0.76–1.27)
GFR calc Af Amer: 90 mL/min/{1.73_m2} (ref >59)
GFR calc non Af Amer: 78 mL/min/{1.73_m2} (ref >59)
Globulin, Total: 3.2 g/dL (ref 1.5–4.5)
Glucose: 116 mg/dL — ABNORMAL HIGH (ref 65–99)
Potassium: 4.6 mmol/L (ref 3.5–5.2)
Sodium: 143 mmol/L (ref 134–144)
Total Protein: 7.5 g/dL (ref 6.0–8.5)

## 2019-11-02 LAB — CBC WITH DIFFERENTIAL/PLATELET
Basophils Absolute: 0.1 10*3/uL (ref 0.0–0.2)
Basos: 1 %
EOS (ABSOLUTE): 0.2 10*3/uL (ref 0.0–0.4)
Eos: 2 %
Hematocrit: 44 % (ref 37.5–51.0)
Hemoglobin: 15.1 g/dL (ref 13.0–17.7)
Immature Grans (Abs): 0 10*3/uL (ref 0.0–0.1)
Immature Granulocytes: 0 %
Lymphocytes Absolute: 2.3 10*3/uL (ref 0.7–3.1)
Lymphs: 26 %
MCH: 29.7 pg (ref 26.6–33.0)
MCHC: 34.3 g/dL (ref 31.5–35.7)
MCV: 86 fL (ref 79–97)
Monocytes Absolute: 0.8 10*3/uL (ref 0.1–0.9)
Monocytes: 9 %
Neutrophils Absolute: 5.5 10*3/uL (ref 1.4–7.0)
Neutrophils: 62 %
Platelets: 208 10*3/uL (ref 150–450)
RBC: 5.09 x10E6/uL (ref 4.14–5.80)
RDW: 12.1 % (ref 11.6–15.4)
WBC: 8.8 10*3/uL (ref 3.4–10.8)

## 2019-11-02 NOTE — Progress Notes (Signed)
His labs are fine. Please ask if he has enough Brilinta to take until he sees the neurologist. It looks like he just filled a 90 day prescription. Also, please schedule him to return in office in 4 weeks for a follow up. Yesterday we scheduled him for a 3 month follow up.

## 2019-12-01 ENCOUNTER — Other Ambulatory Visit: Payer: Self-pay | Admitting: Medical

## 2019-12-01 DIAGNOSIS — I4891 Unspecified atrial fibrillation: Secondary | ICD-10-CM

## 2019-12-01 DIAGNOSIS — I639 Cerebral infarction, unspecified: Secondary | ICD-10-CM

## 2019-12-02 ENCOUNTER — Ambulatory Visit: Payer: Medicare PPO | Admitting: Family Medicine

## 2019-12-02 ENCOUNTER — Other Ambulatory Visit: Payer: Self-pay

## 2019-12-02 ENCOUNTER — Encounter: Payer: Self-pay | Admitting: Family Medicine

## 2019-12-02 VITALS — BP 160/90 | HR 67 | Wt 247.6 lb

## 2019-12-02 DIAGNOSIS — R4586 Emotional lability: Secondary | ICD-10-CM

## 2019-12-02 DIAGNOSIS — E7800 Pure hypercholesterolemia, unspecified: Secondary | ICD-10-CM | POA: Diagnosis not present

## 2019-12-02 DIAGNOSIS — I69320 Aphasia following cerebral infarction: Secondary | ICD-10-CM | POA: Diagnosis not present

## 2019-12-02 DIAGNOSIS — Z87891 Personal history of nicotine dependence: Secondary | ICD-10-CM

## 2019-12-02 DIAGNOSIS — I63232 Cerebral infarction due to unspecified occlusion or stenosis of left carotid arteries: Secondary | ICD-10-CM | POA: Diagnosis not present

## 2019-12-02 DIAGNOSIS — E669 Obesity, unspecified: Secondary | ICD-10-CM

## 2019-12-02 DIAGNOSIS — I10 Essential (primary) hypertension: Secondary | ICD-10-CM

## 2019-12-02 MED ORDER — LOSARTAN POTASSIUM 50 MG PO TABS: 50.0000 mg | ORAL_TABLET | Freq: Every day | ORAL | 0 refills | Status: DC

## 2019-12-02 MED ORDER — CLOPIDOGREL BISULFATE 75 MG PO TABS: 75.0000 mg | ORAL_TABLET | Freq: Every day | ORAL | 1 refills | Status: DC

## 2019-12-02 NOTE — Progress Notes (Signed)
Subjective:    Patient ID: Juan Logan, male    DOB: 1955/12/17, 64 y.o.   MRN: [id number]  HPI Chief Complaint  Patient presents with  . 4 week follow-up    stroke, having some medicine issues. brilinta.    He is here for follow up since establishing care with me approximately 4 weeks ago after being discharged from the hospital. His sister is with him today.   She reports he is still having a lot of issues with his memory and speaking.   To review: He presented to the ED on 10/14/2019 with aphasia and right sided weakness. Code stroke initiated and he was found to have an acute ischemic infarct.  He was found to have a left MCA infarct with hemorrhagic conversion due to left ICA occlusion and left MCA thrombus s/p thrombectomy and carotid stenting. Discharged on aspirin 81 mg and Brilinta.   States he stopped Brilinta 3 weeks ago and did not let me know. States it made him "sick". He felt tired, short of breath and had a headache. States he is still taking aspirin 81 mg and atorvastatin 40 mg daily.   States he has not yet heard from neuro rehab or therapists.   He did not get a BP machine as I had requested to check his BP at home. He has had elevated BP.   His sister states he is getting ready to send in the heart monitor. He wore it for 30 days per Dr. Antoine Poche orders.   states he has not smoked since being in the hospital.   Has appt with Dr. Pearlean Brownie, neurologist on 12/14/2019  Has orders for speech PT, OT, and speech therapy.  Carotid US ordered for 01/20/2020  Denies fever, chills, dizziness, headache, chest pain, palpitations, shortness of breath, abdominal pain, N/V/D, urinary symptoms, LE edema.   Reviewed allergies, medications, past medical, surgical, family, and social history.     Review of Systems Pertinent positives and negatives in the history of present illness.     Objective:   Physical Exam Constitutional:      General: He is not in acute  distress.    Appearance: Normal appearance. He is not ill-appearing.  Eyes:     General: No visual field deficit.    Extraocular Movements: Extraocular movements intact.     Conjunctiva/sclera: Conjunctivae normal.     Pupils: Pupils are equal, round, and reactive to light.  Cardiovascular:     Rate and Rhythm: Normal rate and regular rhythm.     Pulses: Normal pulses.     Heart sounds: Normal heart sounds.  Pulmonary:     Effort: Pulmonary effort is normal.     Breath sounds: Normal breath sounds.  Musculoskeletal:     Cervical back: Neck supple.  Neurological:     Mental Status: He is alert.     Cranial Nerves: No facial asymmetry.     Motor: Motor function is intact. No weakness or pronator drift.     Coordination: Romberg sign negative.     Gait: Gait is intact.     Comments: Aphasia unchanged    BP (!) 160/90   Pulse 67   Wt 247 lb 9.6 oz (112.3 kg)   BMI 32.22 kg/m       Assessment & Plan:  Cerebrovascular accident (CVA) due to occlusion of left carotid artery (HCC) - Plan: clopidogrel (PLAVIX) 75 MG tablet -he stopped Brilinta 3 weeks ago due to side effects. He is willing  to try Plavix. Continue on ASA 81 mg. Discussed goal of decreasing risk for another stroke. He seems to understand but then asks the same questions. His sister states she understands and will help him.  He has appt with Dr. Pearlean Brownie on 12/14/2019.   Aphasia S/P CVA -order has been placed for speech therapy and neuro rehab for weeks. My assistant will check on this.   Pure hypercholesterolemia -continue statin therapy.   Former smoker -he has not started back smoking  Obesity (BMI 30-39.9)  Mood changes -his sister thinks he is short tempered, worse than before his stroke  Hypertension, unspecified type - Plan: losartan (COZAAR) 50 MG tablet -discussed patient with Dr. Susann Givens and I will start patient on losartan due to elevated BP. Encouraged him to get a BP machine and keep a close eye on  his BP at home.

## 2019-12-02 NOTE — Patient Instructions (Signed)
Start taking losartan 50 mg once daily.  Start taking clopidogrel 75 mg once daily.  Continue taking aspirin 81 mg.  Continue taking atorvastatin daily.  Buy blood pressure cuff and start checking your blood pressure at home daily and take your readings to your neurology appointment.

## 2019-12-06 ENCOUNTER — Telehealth: Payer: Self-pay | Admitting: Family Medicine

## 2019-12-06 NOTE — Telephone Encounter (Signed)
We need the neurologist  and rehab specialists to help [place] with determining his functional status. Let's see what they say on October 5th. I am unable to write the letter they are requesting at this time.

## 2019-12-06 NOTE — Telephone Encounter (Signed)
Pt sister Larene Beach called and states that pt needs a letter stating that pt is  incompetented since pt had stroke, so that they could get help with some bills they got  Pt also states that the cozaar was making his head hurt so the sister told him to only take a half of pill  She said call her if the letter could be written [phone]

## 2019-12-06 NOTE — Telephone Encounter (Signed)
Tried to call EVA that is on hipaa. Vickie is not.

## 2019-12-07 ENCOUNTER — Telehealth: Payer: Self-pay | Admitting: Family Medicine

## 2019-12-07 NOTE — Telephone Encounter (Signed)
Set up an appointment to see Chip Boer

## 2019-12-07 NOTE — Telephone Encounter (Signed)
I called & scheduled appt with VIckie & advised to take pt to ED or call 911 if pt gets worse.  Looks like Vickie is not on HIPPA.  I printed out the HIPPA for thursdays appt and a blank one in case pt wants to add someone.

## 2019-12-07 NOTE — Telephone Encounter (Signed)
I have advised Juan Logan that a letter can not be written and needs to see neurology for this. Juan Logan was also notified that Vickie (sister) is not on Hipaa and we can not call her about pt's health. I am sending out a new hipaa form for vickie to fill out since patient lives with her. Once we get it back then we can speak to vickie. Juan Logan will relay the message

## 2019-12-07 NOTE — Telephone Encounter (Signed)
I have 30 minute openings Thursday morning. Please schedule him. Thanks. I am requesting that the sister who is on his HIPAA come to the visit. I have seen 2 different sisters so far and I do not who is actually his HCPOA or on HIPPAA

## 2019-12-07 NOTE — Telephone Encounter (Signed)
Pt's sister called and states that pt was started on Plavix. Since starting medication pt has developed headache, stiffneck and memory issues. She states pt is in a "bad way" . Please advise pt's sister at [phone]. I am sending this back to [place] and Vickie due to Vickie being out of the office. Sister that called Juan Logan is not HIPAA

## 2019-12-09 ENCOUNTER — Other Ambulatory Visit: Payer: Self-pay

## 2019-12-09 ENCOUNTER — Encounter: Payer: Self-pay | Admitting: Family Medicine

## 2019-12-09 ENCOUNTER — Ambulatory Visit (INDEPENDENT_AMBULATORY_CARE_PROVIDER_SITE_OTHER): Payer: Medicare PPO | Admitting: Family Medicine

## 2019-12-09 VITALS — BP 120/80 | HR 83 | Temp 98.0°F | Wt 246.0 lb

## 2019-12-09 DIAGNOSIS — Z87891 Personal history of nicotine dependence: Secondary | ICD-10-CM | POA: Diagnosis not present

## 2019-12-09 DIAGNOSIS — I63232 Cerebral infarction due to unspecified occlusion or stenosis of left carotid arteries: Secondary | ICD-10-CM

## 2019-12-09 DIAGNOSIS — R4586 Emotional lability: Secondary | ICD-10-CM

## 2019-12-09 DIAGNOSIS — I69320 Aphasia following cerebral infarction: Secondary | ICD-10-CM

## 2019-12-09 DIAGNOSIS — E7800 Pure hypercholesterolemia, unspecified: Secondary | ICD-10-CM | POA: Diagnosis not present

## 2019-12-09 NOTE — Progress Notes (Signed)
   Subjective:    Patient ID: Juan Logan, male    DOB: 1955-08-25, 64 y.o.   MRN: [id number]  HPI Chief Complaint  Patient presents with  . not feeling right    took plavix, losartan, and atorvastatin and started having headache, stiffneck, and memory issues, short tempered but since stopping 2-3 days and feeling better   He is here to discuss medications.  Juan Logan, his sister, is with him today. Patient lives with this sister and her husband. She is on his HIPAA and plans to attend his neurology appointment next week.    States he took the Plavix and losartan for a couple of days and felt very tired and had a headache.  States he stopped the medications and is currently only taking an aspirin 81 mg. States he feels much better.  He and his sister report he is doing much better with his speech.  He also reports stopping his statin.   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 States "God told me the day before I had a stroke that this would happen".   He has concerns regarding whether he can go back to work. 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  I recommended he not drive again since his stroke until cleared by neurology but he reports he has been driving this whole time.   I have been questioning why he has not been to neuro rehab and his sister states he had an appointment with neuro rehab and he did not go.    No other concerns today.   Denies fever, chills, dizziness, headache, chest pain, palpitations, shortness of breath, abdominal pain, N/V/D.      Review of Systems  Pertinent positives and negatives in the history of present illness.       Objective:   Physical Exam BP 120/80   Pulse 83   Temp 98 F (36.7 C)   Wt 246 lb (111.6 kg)   SpO2 98%   BMI 32.02 kg/m   Alert and in no acute distress.  Not otherwise examined      Assessment & Plan:  Cerebrovascular accident (CVA) due to occlusion of left carotid artery  (HCC)  Pure hypercholesterolemia  Mood changes  Former smoker  Aphasia S/P CVA  Discussed that he is at an increased risk of another stroke by stopping his medications. He and his sister verbalize understanding of this fact. He prefers to discuss this with his neurologist next week.  He has an appt with Dr. Pearlean Brownie on October 5th.  He did not go to neuro rehab as scheduled and does not seem interested in scheduling with them.  He and his sister have questions regarding when he can go back to work parking cars. He has apparently been driving since discharge home from the hospital for CVA.  Aphasia slightly improved today.  Recommend he take his questions to his appt with Dr. Pearlean Brownie next week. I am not sure how I can help him since he does not listen to my recommendations.

## 2019-12-14 ENCOUNTER — Encounter: Payer: Self-pay | Admitting: Neurology

## 2019-12-14 ENCOUNTER — Ambulatory Visit (INDEPENDENT_AMBULATORY_CARE_PROVIDER_SITE_OTHER): Payer: Medicare PPO | Admitting: Neurology

## 2019-12-14 VITALS — BP 186/73 | HR 77 | Ht 74.0 in | Wt 256.0 lb

## 2019-12-14 DIAGNOSIS — I6522 Occlusion and stenosis of left carotid artery: Secondary | ICD-10-CM | POA: Diagnosis not present

## 2019-12-14 DIAGNOSIS — R413 Other amnesia: Secondary | ICD-10-CM | POA: Diagnosis not present

## 2019-12-14 DIAGNOSIS — I63032 Cerebral infarction due to thrombosis of left carotid artery: Secondary | ICD-10-CM

## 2019-12-14 MED ORDER — AMLODIPINE BESYLATE 5 MG PO TABS: 5.0000 mg | ORAL_TABLET | Freq: Every day | ORAL | 11 refills | Status: DC

## 2019-12-14 MED ORDER — ROSUVASTATIN CALCIUM 5 MG PO TABS: 5.0000 mg | ORAL_TABLET | Freq: Every day | ORAL | 11 refills | Status: DC

## 2019-12-14 MED ORDER — CLOPIDOGREL BISULFATE 75 MG PO TABS: 75.0000 mg | ORAL_TABLET | Freq: Every day | ORAL | 11 refills | Status: DC

## 2019-12-14 NOTE — Progress Notes (Signed)
[facility] [address] [place]. [place] [address] [phone]       OFFICE CONSULT NOTE  Mr. Juan Logan Date of Birth:  08/04/1955 Medical Record Number:  [id number]   Referring MD: Marvel Plan  Reason for Referral: Stroke  HPI: Juan Logan is a pleasant 64 year old Caucasian male seen today for initial office consultation visit for stroke.  He is accompanied by his wife.  History is obtained from them, review of electronic medical records and I personally reviewed available imaging films in PACS.  He has past medical history of hypertension hyperlipidemia and obesity who presented to [facility] on 10/15/2019 with sudden onset of speech difficulties in the morning when he called his sister.  He was not answering questions fully but with only 1 word answers.  EMS was called to his house they found him to be aphasic.  They called a code stroke in route and took him to [facility] and he was found to have dense right homonymous hemianopsia and dense expressive aphasia.  CT scan showed asymmetric hyperdensity of the left M1 and possible proximal left M2 branch vessel thrombus.  There is also early infarct changes in the left insula.  CTA angiogram of the head and neck were performed which showed left carotid occlusion and terminal ICA and M1 occlusion and CT perfusion showed a salvageable penumbra.  Patient was immediately taken to interventional radiology where he underwent successful revascularization of the occluded left carotid with rescue angioplasty and stenting along with mechanical thrombectomy of the terminal left ICA to occlusion and left M1 occlusion by Dr. [name]. Dorice Lamas.  He was kept in the intensive care unit and did well.  He was extubated.  Blood sugar was tightly controlled.  Subsequently echocardiogram was obtained which showed normal ejection fraction without cardiac source of embolism.  Urine drug screen was negative.  LDL cholesterol is  elevated at 123 mg percent.  Hemoglobin A1c was 5.2.  Patient subsequently had a outpatient 30-day Holter monitor from 11/01/2019 to 12/02/2019 which showed no evidence of atrial fibrillation no significant arrhythmias.  Patient was discharged home on aspirin and Plavix but states he discontinue the Plavix since he is complained of dizziness and sleepiness.  He in fact even stopped his Cozaar as well as Lipitor as he did not know which of the medications was causing side effects.  Is currently taking only aspirin 81 mg daily.  Today's blood pressure is significantly elevated 186/73 in office.  He has had no recurrent TIA or stroke symptoms.  However he continues to have some word finding difficulties and naming difficulties.  The wife is also noticed that his short-term memory is not the same.  He has no physical damage left from the stroke.  He wants to go back to work and start driving soon.  He has not had any home physical occupational therapy and has not been referred for outpatient speech therapy either. ROS:   14 system review of systems is positive for memory loss, word finding difficulties, speech difficulties, decreased stamina, bruising, naming difficulties and all other systems negative PMH:  Past Medical History:  Diagnosis Date   Hernia of abdominal wall    Stroke (HCC) 10/18/2019   ischemic    Social History:  Social History   Socioeconomic History   Marital status: Divorced    Spouse name: Vicky   Number of children: 1   Years of education: Not on file   Highest education level:  Associate degree: academic program  Occupational History   Occupation: retired  Tobacco Use   Smoking status: Former Smoker    Packs/day: 1.00    Years: 5.00    Pack years: 5.00    Types: Cigarettes    Quit date: 10/18/2019    Years since quitting: 0.1   Smokeless tobacco: Never Used  Vaping Use   Vaping Use: Never used  Substance and Sexual Activity   Alcohol use: Never   Drug use:  Never   Sexual activity: Not on file  Other Topics Concern   Not on file  Social History Narrative   Lives with Spouse   Right Handed   Drinks 9 cups of caffeine   Social Determinants of Health   Financial Resource Strain:    Difficulty of Paying Living Expenses: Not on file  Food Insecurity:    Worried About Programme researcher, broadcasting/film/video in the Last Year: Not on file   [employer] of Food in the Last Year: Not on file  Transportation Needs:    Lack of Transportation (Medical): Not on file   Lack of Transportation (Non-Medical): Not on file  Physical Activity:    Days of Exercise per Week: Not on file   Minutes of Exercise per Session: Not on file  Stress:    Feeling of Stress : Not on file  Social Connections:    Frequency of Communication with Friends and Family: Not on file   Frequency of Social Gatherings with Friends and Family: Not on file   Attends Religious Services: Not on file   Active Member of Clubs or Organizations: Not on file   Attends Banker Meetings: Not on file   Marital Status: Not on file  Intimate Partner Violence:    Fear of Current or Ex-Partner: Not on file   Emotionally Abused: Not on file   Physically Abused: Not on file   Sexually Abused: Not on file    Medications:   Current Outpatient Medications on File Prior to Visit  Medication Sig Dispense Refill   acetaminophen (TYLENOL) 500 MG tablet Take 1,000 mg by mouth every 6 (six) hours as needed for headache (pain).     aspirin EC 81 MG EC tablet Take 1 tablet (81 mg total) by mouth daily. Swallow whole. 30 tablet 11   clopidogrel (PLAVIX) 75 MG tablet Take 1 tablet (75 mg total) by mouth daily. (Patient not taking: Reported on 12/09/2019) 30 tablet 1   No current facility-administered medications on file prior to visit.    Allergies:   Allergies  Allergen Reactions   Brilinta [Ticagrelor]     Heart palpations and V & N    Physical Exam General: Mildly obese  middle-aged Caucasian male, seated, in no evident distress Head: head normocephalic and atraumatic.   Neck: supple with no carotid or supraclavicular bruits Cardiovascular: regular rate and rhythm, no murmurs Musculoskeletal: no deformity Skin:  no rash/petichiae Vascular:  Normal pulses all extremities  Neurologic Exam Mental Status: Awake and fully alert. Oriented to place and time. Recent and remote memory diminished. Attention span, concentration and fund of knowledge appropriate. Mood and affect appropriate.  Diminished recall 0/3.  Able to name only 5 animals that can walk on 4 legs. Cranial Nerves: Fundoscopic exam reveals sharp disc margins. Pupils equal, briskly reactive to light. Extraocular movements full without nystagmus. Visual fields full to confrontation. Hearing intact. Facial sensation intact. Face, tongue, palate moves normally and symmetrically.  Motor: Normal bulk and tone.  Normal strength in all tested extremity muscles. Sensory.: intact to touch , pinprick , position and vibratory sensation.  Coordination: Rapid alternating movements normal in all extremities. Finger-to-nose and heel-to-shin performed accurately bilaterally. Gait and Station: Arises from chair without difficulty. Stance is normal. Gait demonstrates normal stride length and balance . Able to heel, toe and tandem walk with slight difficulty.  Reflexes: 1+ and symmetric. Toes downgoing.   NIHSS 1 Modified Rankin 2  ASSESSMENT: 64 year old Caucasian male with left MCA branch infarct in August 2021 secondary to symptomatic from left carotid occlusion treated with emergent left carotid angioplasty stenting with mechanical thrombectomy and revascularization of the left MCA and terminal ICA.  Patient seems to be doing remarkably well but has stopped his antiplatelets and blood pressure and cholesterol medicines due to suppose it side effects.  Vascular risk factors of hypertension, hyperlipidemia, carotid disease,  obesity .     PLAN: I had a Crace d/w patient and his wife  about his recent stroke,left carotid occlusion and angioplasty stenting risk for recurrent stroke/TIAs, personally independently reviewed imaging studies and stroke evaluation results and answered questions.Continue aspirin 81 mg daily and add Plavix 75 mg daily for secondary stroke prevention and maintain strict control of hypertension with blood pressure goal below 130/90, diabetes with hemoglobin A1c goal below 6.5% and lipids with LDL cholesterol goal below 70 mg/dL. I also advised the patient to eat a healthy diet with plenty of whole grains, cereals, fruits and vegetables, exercise regularly and maintain ideal body weight. I stressed the need for medication compliance and aggressive risk factor modification to prevent recurrent strokes and carotid stent reocclusion. Recommend start Norvasc 5 mg daily for hypertension and Crestor 5 mg daily for elevated lipids. 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 Also referred to outpatient speech therapy for aphasia training.  Greater than 50% time during this 45-minute visit was spent on counseling and coordination of care about his carotid occlusion and embolic stroke and answering questions.  Followup in the future with my nurse practitioner Shanda Bumps in 3 months or call earlier if necessary. Delia Heady, MD Note: This document was prepared with digital dictation and possible smart phrase technology. Any transcriptional errors that result from this process are unintentional.

## 2019-12-14 NOTE — Patient Instructions (Signed)
I had a Gardin d/w patient and his wife  about his recent stroke,left carotid occlusion and angioplasty stenting risk for recurrent stroke/TIAs, personally independently reviewed imaging studies and stroke evaluation results and answered questions.Continue aspirin 81 mg daily and add Plavix 75 mg daily for secondary stroke prevention and maintain strict control of hypertension with blood pressure goal below 130/90, diabetes with hemoglobin A1c goal below 6.5% and lipids with LDL cholesterol goal below 70 mg/dL. I also advised the patient to eat a healthy diet with plenty of whole grains, cereals, fruits and vegetables, exercise regularly and maintain ideal body weight. I stressed the need for him medication compliance and aggressive risk factor modification to prevent recurrent strokes and carotid stent reocclusion. Recommend start Norvasc 5 mg daily for hypertension and Crestor 5 mg daily for elevated lipids. 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 Also referred to outpatient speech therapy for aphasia training. Followup in the future with my nurse practitioner Shanda Bumps in 3 months or call earlier if necessary.  Stroke Prevention Some medical conditions and behaviors are associated with a higher chance of having a stroke. You can help prevent a stroke by making nutrition, lifestyle, and other changes, including managing any medical conditions you may have. What nutrition changes can be made?   Eat healthy foods. You can do this by: ? Choosing foods high in fiber, such as fresh fruits and vegetables and whole grains. ? Eating at least 5 or more servings of fruits and vegetables a day. Try to fill half of your plate at each meal with fruits and vegetables. ? Choosing lean protein foods, such as lean cuts of meat, poultry without skin, fish, tofu, beans, and nuts. ? Eating low-fat dairy products. ? Avoiding foods that are high in  salt (sodium). This can help lower blood pressure. ? Avoiding foods that have saturated fat, trans fat, and cholesterol. This can help prevent high cholesterol. ? Avoiding processed and premade foods.  Follow your health care provider's specific guidelines for losing weight, controlling high blood pressure (hypertension), lowering high cholesterol, and managing diabetes. These may include: ? Reducing your daily calorie intake. ? Limiting your daily sodium intake to 1,500 milligrams (mg). ? Using only healthy fats for cooking, such as olive oil, canola oil, or sunflower oil. ? Counting your daily carbohydrate intake. What lifestyle changes can be made?  Maintain a healthy weight. Talk to your health care provider about your ideal weight.  Get at least 30 minutes of moderate physical activity at least 5 days a week. Moderate activity includes brisk walking, biking, and swimming.  Do not use any products that contain nicotine or tobacco, such as cigarettes and e-cigarettes. If you need help quitting, ask your health care provider. It may also be helpful to avoid exposure to secondhand smoke.  Limit alcohol intake to no more than 1 drink a day for nonpregnant women and 2 drinks a day for men. One drink equals 12 oz of beer, 5 oz of wine, or 1 oz of hard liquor.  Stop any illegal drug use.  Avoid taking birth control pills. Talk to your health care provider about the risks of taking birth control pills if: ? You are over 10 years old. ? You smoke. ? You get migraines. ? You have ever had a blood clot. What other changes can be made?  Manage your cholesterol levels. ? Eating a healthy diet is important for  preventing high cholesterol. If cholesterol cannot be managed through diet alone, you may also need to take medicines. ? Take any prescribed medicines to control your cholesterol as told by your health care provider.  Manage your diabetes. ? Eating a healthy diet and exercising  regularly are important parts of managing your blood sugar. If your blood sugar cannot be managed through diet and exercise, you may need to take medicines. ? Take any prescribed medicines to control your diabetes as told by your health care provider.  Control your hypertension. ? To reduce your risk of stroke, try to keep your blood pressure below 130/80. ? Eating a healthy diet and exercising regularly are an important part of controlling your blood pressure. If your blood pressure cannot be managed through diet and exercise, you may need to take medicines. ? Take any prescribed medicines to control hypertension as told by your health care provider. ? Ask your health care provider if you should monitor your blood pressure at home. ? Have your blood pressure checked every year, even if your blood pressure is normal. Blood pressure increases with age and some medical conditions.  Get evaluated for sleep disorders (sleep apnea). Talk to your health care provider about getting a sleep evaluation if you snore a lot or have excessive sleepiness.  Take over-the-counter and prescription medicines only as told by your health care provider. Aspirin or blood thinners (antiplatelets or anticoagulants) may be recommended to reduce your risk of forming blood clots that can lead to stroke.  Make sure that any other medical conditions you have, such as atrial fibrillation or atherosclerosis, are managed. What are the warning signs of a stroke? The warning signs of a stroke can be easily remembered as BEFAST.  B is for balance. Signs include: ? Dizziness. ? Loss of balance or coordination. ? Sudden trouble walking.  E is for eyes. Signs include: ? A sudden change in vision. ? Trouble seeing.  F is for face. Signs include: ? Sudden weakness or numbness of the face. ? The face or eyelid drooping to one side.  A is for arms. Signs include: ? Sudden weakness or numbness of the arm, usually on one side  of the body.  S is for speech. Signs include: ? Trouble speaking (aphasia). ? Trouble understanding.  T is for time. ? These symptoms may represent a serious problem that is an emergency. Do not wait to see if the symptoms will go away. Get medical help right away. Call your local emergency services (911 in the [place] Do not drive yourself to the hospital.  Other signs of stroke may include: ? A sudden, severe headache with no known cause. ? Nausea or vomiting. ? Seizure. Where to find more information For more information, visit:  American Stroke Association: www.strokeassociation.org  National Stroke Association: www.stroke.org Summary  You can prevent a stroke by eating healthy, exercising, not smoking, limiting alcohol intake, and managing any medical conditions you may have.  Do not use any products that contain nicotine or tobacco, such as cigarettes and e-cigarettes. If you need help quitting, ask your health care provider. It may also be helpful to avoid exposure to secondhand smoke.  Remember BEFAST for warning signs of stroke. Get help right away if you or a loved one has any of these signs. This information is not intended to replace advice given to you by your health care provider. Make sure you discuss any questions you have with your health care provider. Document Revised: 02/07/2017  Document Reviewed: 04/02/2016 Elsevier Patient Education  [employer].

## 2019-12-28 ENCOUNTER — Ambulatory Visit: Payer: Medicare PPO | Attending: Neurology | Admitting: Speech Pathology

## 2019-12-30 ENCOUNTER — Ambulatory Visit: Payer: Medicare PPO | Admitting: Speech Pathology

## 2020-01-04 ENCOUNTER — Ambulatory Visit: Payer: Medicare PPO | Admitting: Speech Pathology

## 2020-01-06 ENCOUNTER — Encounter: Payer: Medicare PPO | Admitting: Speech Pathology

## 2020-01-14 ENCOUNTER — Encounter: Payer: Medicare PPO | Admitting: Speech Pathology

## 2020-01-18 ENCOUNTER — Encounter: Payer: Medicare PPO | Admitting: Speech Pathology

## 2020-01-19 ENCOUNTER — Other Ambulatory Visit: Payer: Self-pay

## 2020-01-19 NOTE — Patient Outreach (Signed)
[facility]) Care Management  01/19/2020  Juan Logan 1956/01/03 [id number]   Telephone outreach to patient to obtain mRS was successfully completed. MRS=1  [name] under supervision of Domingo Cocking [facility] Care Management Assistant [phone]

## 2020-01-20 ENCOUNTER — Ambulatory Visit (HOSPITAL_COMMUNITY): Admission: RE | Admit: 2020-01-20 | Payer: Medicare PPO | Source: Ambulatory Visit

## 2020-01-21 ENCOUNTER — Other Ambulatory Visit: Payer: Self-pay

## 2020-01-21 ENCOUNTER — Encounter: Payer: Medicare PPO | Admitting: Speech Pathology

## 2020-01-21 ENCOUNTER — Ambulatory Visit (HOSPITAL_COMMUNITY)
Admission: RE | Admit: 2020-01-21 | Discharge: 2020-01-21 | Disposition: A | Discharge status: Home / Self Care | Payer: Medicare PPO | Source: Ambulatory Visit | Attending: Neuroradiology | Admitting: Neuroradiology

## 2020-01-21 DIAGNOSIS — I639 Cerebral infarction, unspecified: Secondary | ICD-10-CM | POA: Insufficient documentation

## 2020-01-21 NOTE — Progress Notes (Signed)
Carotid study completed.   See CVProc for preliminary results.   Micajah Dennin, RDMS, RVT 

## 2020-01-26 ENCOUNTER — Encounter: Payer: Medicare PPO | Admitting: Speech Pathology

## 2020-01-28 ENCOUNTER — Encounter: Payer: Medicare PPO | Admitting: Speech Pathology

## 2020-02-01 ENCOUNTER — Other Ambulatory Visit: Payer: Self-pay

## 2020-02-01 ENCOUNTER — Encounter: Payer: Self-pay | Admitting: Family Medicine

## 2020-02-01 ENCOUNTER — Ambulatory Visit (INDEPENDENT_AMBULATORY_CARE_PROVIDER_SITE_OTHER): Payer: Medicare PPO | Admitting: Family Medicine

## 2020-02-01 VITALS — BP 130/68 | HR 58 | Wt 246.0 lb

## 2020-02-01 DIAGNOSIS — Z9119 Patient's noncompliance with other medical treatment and regimen: Secondary | ICD-10-CM | POA: Diagnosis not present

## 2020-02-01 DIAGNOSIS — E7800 Pure hypercholesterolemia, unspecified: Secondary | ICD-10-CM | POA: Diagnosis not present

## 2020-02-01 DIAGNOSIS — I10 Essential (primary) hypertension: Secondary | ICD-10-CM | POA: Diagnosis not present

## 2020-02-01 DIAGNOSIS — I6930 Unspecified sequelae of cerebral infarction: Secondary | ICD-10-CM

## 2020-02-01 DIAGNOSIS — Z87891 Personal history of nicotine dependence: Secondary | ICD-10-CM

## 2020-02-01 DIAGNOSIS — I69320 Aphasia following cerebral infarction: Secondary | ICD-10-CM

## 2020-02-01 DIAGNOSIS — Z91199 Patient's noncompliance with other medical treatment and regimen due to unspecified reason: Secondary | ICD-10-CM

## 2020-02-01 NOTE — Progress Notes (Signed)
   Subjective:    Patient ID: Juan Logan, male    DOB: [birth year], 64 y.o.   MRN: [id number]  HPI Chief Complaint  Patient presents with  . 3 month follow-up    3 month follow-up   He is here for a 3 month follow up on chronic health conditions.  His sister is with him today.  He had a follow-up appointment with Dr. Pearlean Brownie, neurologist, on 12/14/2019 and has a follow-up with nurse practitioner Ihor Austin on April 03, 2020.  He was referred to speech therapy but states he did not go.  His sister states they did not hear from them but she knows he would not go anyway.  He is supposed to be taking amlodipine 5 mg and rosuvastatin 5 mg.  Patient states he is not taking any medication and does not want to. His sister states states patient took Plavix for 4 days. He complained of his "brain felt worse". States he felt like he could not function as well on the medication.   States he is not willing to take any medication.  States he feels better off of medicine.  He is aware that he is increasing his risk of another stroke or cardiac event.  States he is now working part-time.  States he has been driving since he was discharged from the hospital basically.  Denies fever, chills, dizziness, chest pain, palpitations, shortness of breath, abdominal pain, N/V/D, LE edema.      Review of Systems Pertinent positives and negatives in the history of present illness.     Objective:   Physical Exam BP 130/68   Pulse (!) 58   Wt 246 lb (111.6 kg)   BMI 31.58 kg/m   Alert and oriented and slight improvement with his speech noted.  Moving all extremities well.  Normal gait.      Assessment & Plan:  Aphasia S/P CVA  History of CVA with residual deficit  Pure hypercholesterolemia  Hypertension, unspecified type  Former smoker  Personal history of noncompliance with medical treatment, presenting hazards to health  In-depth counseling with patient and his sister regarding his  noncompliance with medication and the risks involved with him having another stroke or acute coronary event.  His sister states she does feel that he understands everything that is going on and has the ability to make the decision to not take his medication.  He does not want to go to speech therapy.  States he does plan on following up with neurology as scheduled in January. I wished him luck but do not anticipate scheduling a follow-up visit for him.  Discussed that his noncompliance with medication is affecting my ability to be able to care for him.

## 2020-02-02 ENCOUNTER — Encounter: Payer: Medicare PPO | Admitting: Speech Pathology

## 2020-04-03 ENCOUNTER — Ambulatory Visit: Payer: Medicare PPO | Admitting: Adult Health

## 2020-05-23 ENCOUNTER — Telehealth (HOSPITAL_COMMUNITY): Payer: Self-pay

## 2020-05-23 NOTE — Telephone Encounter (Signed)
Called to schedule f/u US carotid, pt refuses to go to anymore doctors at this time. They will call if things change. AW

## 2020-06-26 ENCOUNTER — Encounter: Payer: Self-pay | Admitting: Family Medicine

## 2020-06-26 ENCOUNTER — Other Ambulatory Visit: Payer: Self-pay

## 2020-06-26 ENCOUNTER — Ambulatory Visit: Payer: Medicare PPO | Admitting: Family Medicine

## 2020-06-26 DIAGNOSIS — Z8673 Personal history of transient ischemic attack (TIA), and cerebral infarction without residual deficits: Secondary | ICD-10-CM

## 2020-06-26 DIAGNOSIS — I10 Essential (primary) hypertension: Secondary | ICD-10-CM | POA: Diagnosis not present

## 2020-06-26 DIAGNOSIS — F17219 Nicotine dependence, cigarettes, with unspecified nicotine-induced disorders: Secondary | ICD-10-CM

## 2020-06-26 DIAGNOSIS — E7800 Pure hypercholesterolemia, unspecified: Secondary | ICD-10-CM

## 2020-06-26 LAB — COMPREHENSIVE METABOLIC PANEL
ALT: 10 U/L (ref 0–53)
AST: 12 U/L (ref 0–37)
Albumin: 3.8 g/dL (ref 3.5–5.2)
Alkaline Phosphatase: 83 U/L (ref 39–117)
BUN: 8 mg/dL (ref 6–23)
CO2: 30 mEq/L (ref 19–32)
Calcium: 9.6 mg/dL (ref 8.4–10.5)
Chloride: 104 mEq/L (ref 96–112)
Creatinine, Ser: 1.03 mg/dL (ref 0.40–1.50)
GFR: 75.98 mL/min (ref >60.00)
Glucose, Bld: 96 mg/dL (ref 70–99)
Potassium: 5.7 mEq/L — ABNORMAL HIGH (ref 3.5–5.1)
Sodium: 139 mEq/L (ref 135–145)
Total Bilirubin: 0.5 mg/dL (ref 0.2–1.2)
Total Protein: 7.7 g/dL (ref 6.0–8.3)

## 2020-06-26 LAB — HEMOGLOBIN A1C: Hgb A1c MFr Bld: 5.1 % (ref 4.6–6.5)

## 2020-06-26 LAB — LIPID PANEL
Cholesterol: 157 mg/dL (ref 0–200)
HDL: 30.7 mg/dL — ABNORMAL LOW (ref >39.00)
LDL Cholesterol: 108 mg/dL — ABNORMAL HIGH (ref 0–99)
NonHDL: 126.79
Total CHOL/HDL Ratio: 5
Triglycerides: 94 mg/dL (ref 0.0–149.0)
VLDL: 18.8 mg/dL (ref 0.0–40.0)

## 2020-06-26 NOTE — Assessment & Plan Note (Signed)
Discussed the need for statin given his stroke history.  Will check lipid panel today and then make a determination on what statin to start.

## 2020-06-26 NOTE — Patient Instructions (Signed)
Nice to meet you. We will check lab work today.  We will contact you with the results. If you have recurrent stroke symptoms please seek medical attention immediately.

## 2020-06-26 NOTE — Assessment & Plan Note (Addendum)
Advised to discontinue tobacco use.  The patient notes he wants to try to quit cold [place].  I did discuss lung cancer screening with low-dose CT scan.  He wants to think about this.

## 2020-06-26 NOTE — Assessment & Plan Note (Signed)
The patient is hesitant to go on medication unless absolutely needed.  We will have him start checking his blood pressure.  He will return in 3 months for follow-up.

## 2020-06-26 NOTE — Progress Notes (Signed)
[name], MD Phone: [phone]  Juan Logan is a 65 y.o. male who presents today for new patient visit.   HYPERTENSION  Disease Monitoring  Home BP Monitoring not checking Chest pain- no    Dyspnea- no Medications  Compliance-  Taking no medications.  Edema- no  HYPERLIPIDEMIA Symptoms Chest pain on exertion:  no    Medications: Compliance- not on medication Right upper quadrant pain- no  Muscle aches- no  History of stroke: This was in August 2021.  He had aphasia and memory difficulties.  Notes he is slowly improving.  No aphasia at this time.  Does have some slight memory issues.  No numbness or weakness.  Did have a left internal carotid artery stent.  Continues to smoke 4 to 5 cigarettes a day.  He smoked 3 packs/day at the most.  He started at age 75.  The patient reports the medications they started him on following his stroke drove him crazy and gave him thoughts of killing himself.  He notes no suicidal thoughts since coming off of the medications.     Active Ambulatory Problems    Diagnosis Date Noted  . History of CVA (cerebrovascular accident) 10/15/2019  . Loss of taste 11/01/2019  . Pure hypercholesterolemia 11/01/2019  . Nicotine dependence, cigarettes, w unsp disorders 11/01/2019  . Obesity (BMI 30-39.9) 11/01/2019  . Hypertension 06/26/2020   Resolved Ambulatory Problems    Diagnosis Date Noted  . Aphasia S/P CVA 11/01/2019   Past Medical History:  Diagnosis Date  . Hernia of abdominal wall   . Stroke [facility]) 10/18/2019    Family History  Problem Relation Age of Onset  . Hypertension Mother   . Stroke Mother   . Hypertension Father   . Prostate cancer Father     Social History   Socioeconomic History  . Marital status: Divorced    Spouse name: Vicky  . Number of children: 1  . Years of education: Not on file  . Highest education level: Associate degree: academic program  Occupational History  . Occupation: retired  Tobacco Use  .  Smoking status: Former Smoker    Packs/day: 1.00    Years: 5.00    Pack years: 5.00    Types: Cigarettes    Quit date: 10/18/2019    Years since quitting: 0.6  . Smokeless tobacco: Never Used  Vaping Use  . Vaping Use: Never used  Substance and Sexual Activity  . Alcohol use: Never  . Drug use: Never  . Sexual activity: Not on file  Other Topics Concern  . Not on file  Social History Narrative   Lives with Spouse   Right Handed   Drinks 9 cups of caffeine   Social Determinants of Health   Financial Resource Strain: Not on file  Food Insecurity: Not on file  Transportation Needs: Not on file  Physical Activity: Not on file  Stress: Not on file  Social Connections: Not on file  Intimate Partner Violence: Not on file    ROS See HPI  Objective  Physical Exam Vitals:   06/26/20 0957 06/26/20 1041  BP: (!) 144/70 138/76  Pulse: 65   Resp: 16   Temp: (!) 96.5 F (35.8 C)   SpO2: 98%     BP Readings from Last 3 Encounters:  06/26/20 138/76  02/01/20 130/68  12/14/19 (!) 186/73   Wt Readings from Last 3 Encounters:  06/26/20 232 lb 12.8 oz (105.6 kg)  02/01/20 246 lb (111.6 kg)  12/14/19 256 lb (116.1 kg)    Physical Exam Constitutional:      General: He is not in acute distress.    Appearance: He is not diaphoretic.  Cardiovascular:     Rate and Rhythm: Normal rate and regular rhythm.     Heart sounds: Normal heart sounds.  Pulmonary:     Effort: Pulmonary effort is normal.     Breath sounds: Normal breath sounds.  Musculoskeletal:     Right lower leg: No edema.     Left lower leg: No edema.  Skin:    General: Skin is warm and dry.  Neurological:     Mental Status: He is alert.     Comments: EOMI, PERRL, shoulder shrug intact, hearing to finger rub intact, V1 through V3 intact to light touch, 5/5 strength in bilateral biceps, triceps, grip, quads, hamstrings, plantar and dorsiflexion, sensation to light touch intact in bilateral UE and LE, normal gait       Assessment/Plan:   Problem List Items Addressed This Visit    History of CVA (cerebrovascular accident)    Seems to have been recovering more towards his baseline per their report.  The patient does have multiple risk factors for future stroke and cardiovascular events.  I discussed this with him.  Discussed that he should be on cholesterol medication as well as likely on Plavix [place]-term.  He does report some possible side effects related to the medications he was on though it is difficult to say if those symptoms were from the medication or related to the stroke.  The patient does seem willing to potentially try medication depending on what his lab work shows.  Discussed that if we start on additional medication and he has recurrence of suicidal thoughts he should discontinue the medication, let us know immediately, and be evaluated right away.      Relevant Orders   Comp Met (CMET)   Lipid panel   HgB A1c   Pure hypercholesterolemia    Discussed the need for statin given his stroke history.  Will check lipid panel today and then make a determination on what statin to start.      Relevant Orders   Comp Met (CMET)   Lipid panel   Nicotine dependence, cigarettes, w unsp disorders    Advised to discontinue tobacco use.  The patient notes he wants to try to quit cold [place].  I did discuss lung cancer screening with low-dose CT scan.  He wants to think about this.      Hypertension    The patient is hesitant to go on medication unless absolutely needed.  We will have him start checking his blood pressure.  He will return in 3 months for follow-up.      Relevant Orders   Comp Met (CMET)   Lipid panel   HgB A1c     Health maintenance: Discussed colonoscopy referral.  The patient notes he does not want a colonoscopy.  Discussed Cologuard and FOBT and he notes he will think about those.  This visit occurred during the SARS-CoV-2 public health emergency.  Safety protocols were in  place, including screening questions prior to the visit, additional usage of staff PPE, and extensive cleaning of exam room while observing appropriate contact time as indicated for disinfecting solutions.    [name], MD [place]

## 2020-06-26 NOTE — Assessment & Plan Note (Addendum)
Seems to have been recovering more towards his baseline per their report.  The patient does have multiple risk factors for future stroke and cardiovascular events.  I discussed this with him.  Discussed that he should be on cholesterol medication as well as likely on Plavix [place]-term.  He does report some possible side effects related to the medications he was on though it is difficult to say if those symptoms were from the medication or related to the stroke.  The patient does seem willing to potentially try medication depending on what his lab work shows.  Discussed that if we start on additional medication and he has recurrence of suicidal thoughts he should discontinue the medication, let us know immediately, and be evaluated right away.

## 2020-06-28 ENCOUNTER — Other Ambulatory Visit: Payer: Self-pay | Admitting: Family Medicine

## 2020-06-28 DIAGNOSIS — E875 Hyperkalemia: Secondary | ICD-10-CM

## 2020-06-28 DIAGNOSIS — E785 Hyperlipidemia, unspecified: Secondary | ICD-10-CM

## 2020-06-28 MED ORDER — ATORVASTATIN CALCIUM 20 MG PO TABS: 20.0000 mg | ORAL_TABLET | Freq: Every day | ORAL | 3 refills | Status: DC

## 2020-07-07 ENCOUNTER — Other Ambulatory Visit (INDEPENDENT_AMBULATORY_CARE_PROVIDER_SITE_OTHER): Payer: Medicare PPO

## 2020-07-07 ENCOUNTER — Other Ambulatory Visit: Payer: Self-pay

## 2020-07-07 DIAGNOSIS — E875 Hyperkalemia: Secondary | ICD-10-CM | POA: Diagnosis not present

## 2020-07-07 LAB — BASIC METABOLIC PANEL
BUN: 11 mg/dL (ref 6–23)
CO2: 29 mEq/L (ref 19–32)
Calcium: 9.6 mg/dL (ref 8.4–10.5)
Chloride: 101 mEq/L (ref 96–112)
Creatinine, Ser: 0.96 mg/dL (ref 0.40–1.50)
GFR: 82.66 mL/min (ref >60.00)
Glucose, Bld: 101 mg/dL — ABNORMAL HIGH (ref 70–99)
Potassium: 4.3 mEq/L (ref 3.5–5.1)
Sodium: 137 mEq/L (ref 135–145)

## 2020-07-26 ENCOUNTER — Ambulatory Visit (INDEPENDENT_AMBULATORY_CARE_PROVIDER_SITE_OTHER): Payer: Medicare PPO

## 2020-07-26 ENCOUNTER — Telehealth: Payer: Self-pay | Admitting: *Deleted

## 2020-07-26 VITALS — Ht 73.0 in | Wt 232.0 lb

## 2020-07-26 DIAGNOSIS — E785 Hyperlipidemia, unspecified: Secondary | ICD-10-CM

## 2020-07-26 DIAGNOSIS — Z0000 Encounter for general adult medical examination without abnormal findings: Secondary | ICD-10-CM | POA: Diagnosis not present

## 2020-07-26 NOTE — Progress Notes (Signed)
Subjective:   Juan Logan is a 65 y.o. male who presents for an Initial Medicare Annual Wellness Visit.  Review of Systems    No ROS.  Medicare Wellness Virtual Visit.  Visual/audio telehealth visit, UTA vital signs.   See social history for additional risk factors.   Cardiac Risk Factors include: advanced age (>36men, >48 women);male gender     Objective:    Today's Vitals   07/26/20 0825  Weight: 232 lb (105.2 kg)  Height: 6\' 1"  (1.854 m)   Body mass index is 30.61 kg/m.  Advanced Directives 07/26/2020 10/15/2019  Does Patient Have a Medical Advance Directive? No No  Would patient like information on creating a medical advance directive? No - Patient declined No - Patient declined    Current Medications (verified) Outpatient Encounter Medications as of 07/26/2020  Medication Sig  . aspirin EC 81 MG EC tablet Take 1 tablet (81 mg total) by mouth daily. Swallow whole. (Patient not taking: Reported on 06/26/2020)  . atorvastatin (LIPITOR) 20 MG tablet Take 1 tablet (20 mg total) by mouth daily.   No facility-administered encounter medications on file as of 07/26/2020.    Allergies (verified) Brilinta [ticagrelor]   History: Past Medical History:  Diagnosis Date  . 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 Hernia of abdominal wall   . 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 IR CT HEAD LTD  10/15/2019  . IR INTRAVSC STENT CERV CAROTID W/EMB-PROT MOD SED INCL ANGIO  10/15/2019  . IR PERCUTANEOUS ART THROMBECTOMY/INFUSION INTRACRANIAL INC DIAG ANGIO  10/15/2019      . IR PERCUTANEOUS ART THROMBECTOMY/INFUSION INTRACRANIAL INC DIAG ANGIO  10/15/2019  . RADIOLOGY WITH ANESTHESIA N/A 10/15/2019   Procedure: IR WITH ANESTHESIA;  Surgeon: Radiologist, Medication, MD;  Location: [facility] OR;  Service: Radiology;  Laterality: N/A;   Family History  Problem Relation Age of Onset  . Hypertension Mother   . Stroke Mother   . Hypertension Father   .  Prostate cancer Father    Social History   Socioeconomic History  . Marital status: Widowed    Spouse name: Vicky  . Number of children: 1  . Years of education: Not on file  . Highest education level: Associate degree: academic program  Occupational History  . Occupation: retired  Tobacco Use  . Smoking status: Current Every Day Smoker    Types: Cigarettes  . Smokeless tobacco: Never Used  . Tobacco comment: Patient smoked 3 packs/day at the most, started smoking at age 28, now smoking 4 to 5 cigarettes daily  Vaping Use  . Vaping Use: Never used  Substance and Sexual Activity  . Alcohol use: Never  . Drug use: Never  . Sexual activity: Not on file  Other Topics Concern  . Not on file  Social History Narrative   Lives with Spouse   Right Handed   Drinks 9 cups of caffeine   Social Determinants of Health   Financial Resource Strain: Low Risk   . Difficulty of Paying Living Expenses: Not hard at all  Food Insecurity: No Food Insecurity  . Worried About 18 in the Last Year: Never true  . Ran Out of Food in the Last Year: Never true  Transportation Needs: No Transportation Needs  . Lack of Transportation (Medical): No  . Lack of Transportation (Non-Medical): No  Physical Activity: Unknown  . Days of Exercise per Week: 0 days  .  Minutes of Exercise per Session: Not on file  Stress: No Stress Concern Present  . Feeling of Stress : Not at all  Social Connections: Unknown  . Frequency of Communication with Friends and Family: More than three times a week  . Frequency of Social Gatherings with Friends and Family: More than three times a week  . Attends Religious Services: Not on file  . Active Member of Clubs or Organizations: Not on file  . Attends Banker Meetings: Not on file  . Marital Status: Widowed    Tobacco Counseling Ready to quit: Not Answered Counseling given: Not Answered Comment: Patient smoked 3 packs/day at the most,  started smoking at age 44, now smoking 4 to 5 cigarettes daily   Clinical Intake:  Pre-visit preparation completed: Yes        Diabetes: No  How often do you need to have someone help you when you read instructions, pamphlets, or other written materials from your doctor or pharmacy?: 1 - Never   Interpreter Needed?: No      Activities of Daily Living In your present state of health, do you have any difficulty performing the following activities: 07/26/2020 10/15/2019  Hearing? N -  Vision? N -  Difficulty concentrating or making decisions? N -  Walking or climbing stairs? N -  Dressing or bathing? N -  Doing errands, shopping? N N  Preparing Food and eating ? N -  Using the Toilet? N -  In the past six months, have you accidently leaked urine? N -  Do you have problems with loss of bowel control? N -  Managing your Medications? N -  Managing your Finances? N -  Housekeeping or managing your Housekeeping? N -  Some recent data might be hidden    Patient Care Team: Glori Luis, MD as PCP - General (Family Medicine)  Indicate any recent Medical Services you may have received from other than [facility] providers in the past year (date may be approximate).     Assessment:   This is a routine wellness examination for Zafir.  I connected with Quincy today by telephone and verified that I am speaking with the correct person using two identifiers. Location patient: home Location provider: work Persons participating in the virtual visit: patient, Engineer, civil (consulting).    I discussed the limitations, risks, security and privacy concerns of performing an evaluation and management service by telephone and the availability of in person appointments. The patient expressed understanding and verbally consented to this telephonic visit.    Interactive audio and video telecommunications were attempted between this provider and patient, however failed, due to patient having technical difficulties OR  patient did not have access to video capability.  We continued and completed visit with audio only.  Some vital signs may be absent or patient reported.   Hearing/Vision screen  Hearing Screening   125Hz  250Hz  500Hz  1000Hz  2000Hz  3000Hz  4000Hz  6000Hz  8000Hz   Right ear:           Left ear:           Comments: Patient is able to hear conversational tones without difficulty.  No issues reported.  Vision Screening Comments: Does not wear corrective lenses Visual acuity not assessed, virtual visit.       Dietary issues and exercise activities discussed: Current Exercise Habits: The patient does not participate in regular exercise at present  Regular diet; monitoring potassium intake Good water intake  Goals Addressed  This Visit's Progress     Patient Stated   .  Quit Smoking (pt-stated)        Smoke less per day until stopping.       Depression Screen PHQ 2/9 Scores 07/26/2020 06/26/2020 11/01/2019  PHQ - 2 Score 0 0 0    Fall Risk Fall Risk  07/26/2020 06/26/2020 11/01/2019  Falls in the past year? 0 0 0  Number falls in past yr: 0 - 0  Injury with Fall? 0 - 0  Follow up Falls evaluation completed - -    FALL RISK PREVENTION PERTAINING TO THE HOME: Handrails in use when climbing stairs? Yes Home free of loose throw rugs in walkways, pet beds, electrical cords, etc? Yes  Adequate lighting in your home to reduce risk of falls? Yes   ASSISTIVE DEVICES UTILIZED TO PREVENT FALLS: Life alert? No  Use of a cane, walker or w/c? No   TIMED UP AND GO: Was the test performed? No .   Cognitive Function:      6CIT Screen 07/26/2020  What Year? 0 points  What month? 0 points  What time? 0 points  Count back from 20 0 points  Months in reverse 0 points    Immunizations  There is no immunization history on file for this patient.  Health Maintenance Health Maintenance  Topic Date Due  . TETANUS/TDAP  06/26/2021 (Originally 07/08/1974)  . PNA vac Low Risk  Adult (1 of 2 - PCV13) 06/26/2021 (Originally 07/09/2019)  . COLONOSCOPY (Pts 45-7yrs Insurance coverage will need to be confirmed)  07/26/2021 (Originally 07/09/1999)  . INFLUENZA VACCINE  10/09/2020  . HPV VACCINES  Aged Out  . COVID-19 Vaccine  Discontinued  . Hepatitis C Screening  Discontinued   Colonoscopy/Cologuard- declined per patient  Covid vaccine- discontinued per patient.  Hepatitis C Screening: discontinued per patient  Vision Screening: Recommended annual ophthalmology exams for early detection of glaucoma and other disorders of the eye. Is the patient up to date with their annual eye exam?  Yes   Dental Screening: Recommended annual dental exams for proper oral hygiene.  Community Resource Referral / Chronic Care Management: CRR required this visit?  No   CCM required this visit?  No      Plan:  Keep all routine maintenance appointments.   Next scheduled lab 07/28/20 @ 9:00  I have personally reviewed and noted the following in the patient's chart:   . Medical and social history . Use of alcohol, tobacco or illicit drugs  . Current medications and supplements including opioid prescriptions. Patient is not currently taking opioid prescriptions. . Functional ability and status . Nutritional status . Physical activity . Advanced directives . List of other physicians . Hospitalizations, surgeries, and ER visits in previous 12 months . Vitals . Screenings to include cognitive, depression, and falls . Referrals and appointments  In addition, I have reviewed and discussed with patient certain preventive protocols, quality metrics, and best practice recommendations. A written personalized care plan for preventive services as well as general preventive health recommendations were provided to patient via mychart.     Ashok Pall, LPN   05/30/252

## 2020-07-26 NOTE — Telephone Encounter (Signed)
Please place future orders for lab appt.  

## 2020-07-26 NOTE — Patient Instructions (Addendum)
Mr. Juan Logan , Thank you for taking time to come for your Medicare Wellness Visit. I appreciate your ongoing commitment to your health goals. Please review the following plan we discussed and let me know if I can assist you in the future.   These are the goals we discussed: Goals      Patient Stated   .  Quit Smoking (pt-stated)      Smoke less per day until stopping.        This is a list of the screening recommended for you and due dates:  Health Maintenance  Topic Date Due  . COVID-19 Vaccine (1) Never done  . Hepatitis C Screening: USPSTF Recommendation to screen - Ages 66-79 yo.  Never done  . Colon Cancer Screening  Never done  . Tetanus Vaccine  06/26/2021*  . Pneumonia vaccines (1 of 2 - PCV13) 06/26/2021*  . Flu Shot  10/09/2020  . HPV Vaccine  Aged Out  *Topic was postponed. The date shown is not the original due date.    Advanced directives: not yet completed  Conditions/risks identified: none new  Next appointment: Follow up in one year for your annual wellness visit.   Preventive Care 66 Years and Older, Male Preventive care refers to lifestyle choices and visits with your health care provider that can promote health and wellness. What does preventive care include?  A yearly physical exam. This is also called an annual well check.  Dental exams once or twice a year.  Routine eye exams. Ask your health care provider how often you should have your eyes checked.  Personal lifestyle choices, including:  Daily care of your teeth and gums.  Regular physical activity.  Eating a healthy diet.  Avoiding tobacco and drug use.  Limiting alcohol use.  Practicing safe sex.  Taking low doses of aspirin every day.  Taking vitamin and mineral supplements as recommended by your health care provider. What happens during an annual well check? The services and screenings done by your health care provider during your annual well check will depend on your age, overall  health, lifestyle risk factors, and family history of disease. Counseling  Your health care provider may ask you questions about your:  Alcohol use.  Tobacco use.  Drug use.  Emotional well-being.  Home and relationship well-being.  Sexual activity.  Eating habits.  History of falls.  Memory and ability to understand (cognition).  Work and work Astronomer. Screening  You may have the following tests or measurements:  Height, weight, and BMI.  Blood pressure.  Lipid and cholesterol levels. These may be checked every 5 years, or more frequently if you are over 76 years old.  Skin check.  Lung cancer screening. You may have this screening every year starting at age 72 if you have a 30-pack-year history of smoking and currently smoke or have quit within the past 15 years.  Fecal occult blood test (FOBT) of the stool. You may have this test every year starting at age 44.  Flexible sigmoidoscopy or colonoscopy. You may have a sigmoidoscopy every 5 years or a colonoscopy every 10 years starting at age 58.  Prostate cancer screening. Recommendations will vary depending on your family history and other risks.  Hepatitis C blood test.  Hepatitis B blood test.  Sexually transmitted disease (STD) testing.  Diabetes screening. This is done by checking your blood sugar (glucose) after you have not eaten for a while (fasting). You may have this done every 1-3 years.  Abdominal aortic aneurysm (AAA) screening. You may need this if you are a current or former smoker.  Osteoporosis. You may be screened starting at age 26 if you are at high risk. Talk with your health care provider about your test results, treatment options, and if necessary, the need for more tests. Vaccines  Your health care provider may recommend certain vaccines, such as:  Influenza vaccine. This is recommended every year.  Tetanus, diphtheria, and acellular pertussis (Tdap, Td) vaccine. You may need a Td  booster every 10 years.  Zoster vaccine. You may need this after age 56.  Pneumococcal 13-valent conjugate (PCV13) vaccine. One dose is recommended after age 33.  Pneumococcal polysaccharide (PPSV23) vaccine. One dose is recommended after age 21. Talk to your health care provider about which screenings and vaccines you need and how often you need them. This information is not intended to replace advice given to you by your health care provider. Make sure you discuss any questions you have with your health care provider. Document Released: 03/24/2015 Document Revised: 11/15/2015 Document Reviewed: 12/27/2014 Elsevier Interactive Patient Education  2017 [employer].  Fall Prevention in the Home Falls can cause injuries. They can happen to people of all ages. There are many things you can do to make your home safe and to help prevent falls. What can I do on the outside of my home?  Regularly fix the edges of walkways and driveways and fix any cracks.  Remove anything that might make you trip as you walk through a door, such as a raised step or threshold.  Trim any bushes or trees on the path to your home.  Use bright outdoor lighting.  Clear any walking paths of anything that might make someone trip, such as rocks or tools.  Regularly check to see if handrails are loose or broken. Make sure that both sides of any steps have handrails.  Any raised decks and porches should have guardrails on the edges.  Have any leaves, snow, or ice cleared regularly.  Use sand or salt on walking paths during winter.  Clean up any spills in your garage right away. This includes oil or grease spills. What can I do in the bathroom?  Use night lights.  Install grab bars by the toilet and in the tub and shower. Do not use towel bars as grab bars.  Use non-skid mats or decals in the tub or shower.  If you need to sit down in the shower, use a plastic, non-slip stool.  Keep the floor dry. Clean up  any water that spills on the floor as soon as it happens.  Remove soap buildup in the tub or shower regularly.  Attach bath mats securely with double-sided non-slip rug tape.  Do not have throw rugs and other things on the floor that can make you trip. What can I do in the bedroom?  Use night lights.  Make sure that you have a light by your bed that is easy to reach.  Do not use any sheets or blankets that are too big for your bed. They should not hang down onto the floor.  Have a firm chair that has side arms. You can use this for support while you get dressed.  Do not have throw rugs and other things on the floor that can make you trip. What can I do in the kitchen?  Clean up any spills right away.  Avoid walking on wet floors.  Keep items that you use  a lot in easy-to-reach places.  If you need to reach something above you, use a strong step stool that has a grab bar.  Keep electrical cords out of the way.  Do not use floor polish or wax that makes floors slippery. If you must use wax, use non-skid floor wax.  Do not have throw rugs and other things on the floor that can make you trip. What can I do with my stairs?  Do not leave any items on the stairs.  Make sure that there are handrails on both sides of the stairs and use them. Fix handrails that are broken or loose. Make sure that handrails are as [place] as the stairways.  Check any carpeting to make sure that it is firmly attached to the stairs. Fix any carpet that is loose or worn.  Avoid having throw rugs at the top or bottom of the stairs. If you do have throw rugs, attach them to the floor with carpet tape.  Make sure that you have a light switch at the top of the stairs and the bottom of the stairs. If you do not have them, ask someone to add them for you. What else can I do to help prevent falls?  Wear shoes that:  Do not have high heels.  Have rubber bottoms.  Are comfortable and fit you well.  Are  closed at the toe. Do not wear sandals.  If you use a stepladder:  Make sure that it is fully opened. Do not climb a closed stepladder.  Make sure that both sides of the stepladder are locked into place.  Ask someone to hold it for you, if possible.  Clearly mark and make sure that you can see:  Any grab bars or handrails.  First and last steps.  Where the edge of each step is.  Use tools that help you move around (mobility aids) if they are needed. These include:  Canes.  Walkers.  Scooters.  Crutches.  Turn on the lights when you go into a dark area. Replace any light bulbs as soon as they burn out.  Set up your furniture so you have a clear path. Avoid moving your furniture around.  If any of your floors are uneven, fix them.  If there are any pets around you, be aware of where they are.  Review your medicines with your doctor. Some medicines can make you feel dizzy. This can increase your chance of falling. Ask your doctor what other things that you can do to help prevent falls. This information is not intended to replace advice given to you by your health care provider. Make sure you discuss any questions you have with your health care provider. Document Released: 12/22/2008 Document Revised: 08/03/2015 Document Reviewed: 04/01/2014 Elsevier Interactive Patient Education  2017 [employer].

## 2020-07-27 NOTE — Telephone Encounter (Signed)
Ordered

## 2020-07-28 ENCOUNTER — Other Ambulatory Visit: Payer: Self-pay

## 2020-07-28 ENCOUNTER — Other Ambulatory Visit (INDEPENDENT_AMBULATORY_CARE_PROVIDER_SITE_OTHER): Payer: Medicare PPO

## 2020-07-28 DIAGNOSIS — E785 Hyperlipidemia, unspecified: Secondary | ICD-10-CM

## 2020-07-28 LAB — HEPATIC FUNCTION PANEL
ALT: 10 U/L (ref 0–53)
AST: 12 U/L (ref 0–37)
Albumin: 4 g/dL (ref 3.5–5.2)
Alkaline Phosphatase: 77 U/L (ref 39–117)
Bilirubin, Direct: 0.2 mg/dL (ref 0.0–0.3)
Total Bilirubin: 0.7 mg/dL (ref 0.2–1.2)
Total Protein: 7.3 g/dL (ref 6.0–8.3)

## 2020-07-28 LAB — LDL CHOLESTEROL, DIRECT: Direct LDL: 82 mg/dL

## 2020-09-25 ENCOUNTER — Ambulatory Visit: Payer: Medicare PPO | Admitting: Family Medicine

## 2020-09-25 ENCOUNTER — Encounter: Payer: Self-pay | Admitting: Family Medicine

## 2020-09-25 ENCOUNTER — Other Ambulatory Visit: Payer: Self-pay

## 2020-09-25 DIAGNOSIS — I10 Essential (primary) hypertension: Secondary | ICD-10-CM

## 2020-09-25 DIAGNOSIS — F17219 Nicotine dependence, cigarettes, with unspecified nicotine-induced disorders: Secondary | ICD-10-CM | POA: Diagnosis not present

## 2020-09-25 DIAGNOSIS — E7800 Pure hypercholesterolemia, unspecified: Secondary | ICD-10-CM | POA: Diagnosis not present

## 2020-09-25 NOTE — Assessment & Plan Note (Signed)
Adequate today.  We will continue to periodically monitor.

## 2020-09-25 NOTE — Assessment & Plan Note (Signed)
Patient's LDL has improved quite a bit.  Discussed it may be slightly above goal though given his prior adverse reaction to higher doses of cholesterol medication we will continue with his current dose for now.  He will continue Lipitor 20 mg once daily.

## 2020-09-25 NOTE — Assessment & Plan Note (Signed)
Smoking cessation counseling was provided.  Approximately 4 minutes were spent discussing the rationale for tobacco cessation and strategies for doing so.  Adjuncts, including nicotine patches, nicotine lozenges, varenicline and buproprion were recommended.  The patient declines assistance with smoking cessation.  He notes when he is ready to quit he will quit cold [place].  Follow-up in 6 months.  I did discuss lung cancer screening though he declines this at this time.

## 2020-09-25 NOTE — Patient Instructions (Signed)
Nice to see you. Please continue with your Lipitor. Please quit smoking.  If you would like my help please let me know. If you change your mind regarding the CT scan for lung cancer screening please let us know.

## 2020-09-25 NOTE — Progress Notes (Signed)
Marikay Alar, MD Phone: [phone]  Juan Logan is a 65 y.o. male who presents today for f/u.  HYPERTENSION Disease Monitoring Home BP Monitoring not checking Chest pain- no    Dyspnea- no Medications Compliance-  no medications.  Edema- no  HYPERLIPIDEMIA Symptoms Chest pain on exertion:  no   Leg claudication:   no Medications: Compliance- taking Lipitor right upper quadrant pain- no  Muscle aches- no Notes he is tolerating the Lipitor well with no side effects at this dose. Notes prior mental health issues with higher doses of statins.   Nicotine dependence, cigarettes: Patient is thinking of quitting smoking.  He knows he needs to quit smoking.  He has smoked his entire life but did quit for a while.  Social History   Tobacco Use  Smoking Status Every Day   Types: Cigarettes  Smokeless Tobacco Never  Tobacco Comments   Patient smoked 3 packs/day at the most, started smoking at age 2, now smoking 4 to 5 cigarettes daily    Current Outpatient Medications on File Prior to Visit  Medication Sig Dispense Refill   aspirin EC 81 MG EC tablet Take 1 tablet (81 mg total) by mouth daily. Swallow whole. 30 tablet 11   atorvastatin (LIPITOR) 20 MG tablet Take 1 tablet (20 mg total) by mouth daily. 90 tablet 3   No current facility-administered medications on file prior to visit.     ROS see history of present illness  Objective  Physical Exam Vitals:   09/25/20 1317  BP: 130/70  Pulse: 73  Temp: (!) 97.5 F (36.4 C)  SpO2: 97%    BP Readings from Last 3 Encounters:  09/25/20 130/70  06/26/20 138/76  02/01/20 130/68   Wt Readings from Last 3 Encounters:  09/25/20 231 lb 6.4 oz (105 kg)  07/26/20 232 lb (105.2 kg)  06/26/20 232 lb 12.8 oz (105.6 kg)    Physical Exam Constitutional:      General: He is not in acute distress.    Appearance: He is not diaphoretic.  Cardiovascular:     Rate and Rhythm: Normal rate and regular rhythm.     Heart sounds:  Normal heart sounds.  Pulmonary:     Effort: Pulmonary effort is normal.     Breath sounds: Normal breath sounds.  Musculoskeletal:     Right lower leg: No edema.     Left lower leg: No edema.  Skin:    General: Skin is warm and dry.  Neurological:     Mental Status: He is alert.     Assessment/Plan: Please see individual problem list.  Problem List Items Addressed This Visit     Hypertension    Adequate today.  We will continue to periodically monitor.       Nicotine dependence, cigarettes, w unsp disorders    Smoking cessation counseling was provided.  Approximately 4 minutes were spent discussing the rationale for tobacco cessation and strategies for doing so.  Adjuncts, including nicotine patches, nicotine lozenges, varenicline and buproprion were recommended.  The patient declines assistance with smoking cessation.  He notes when he is ready to quit he will quit cold [place].  Follow-up in 6 months.  I did discuss lung cancer screening though he declines this at this time.        Pure hypercholesterolemia    Patient's LDL has improved quite a bit.  Discussed it may be slightly above goal though given his prior adverse reaction to higher doses of cholesterol medication  we will continue with his current dose for now.  He will continue Lipitor 20 mg once daily.       Return in about 6 months (around 03/28/2021).  This visit occurred during the SARS-CoV-2 public health emergency.  Safety protocols were in place, including screening questions prior to the visit, additional usage of staff PPE, and extensive cleaning of exam room while observing appropriate contact time as indicated for disinfecting solutions.    Marikay Alar, MD [facility]

## 2020-11-29 ENCOUNTER — Encounter: Payer: Self-pay | Admitting: Family Medicine

## 2021-02-23 ENCOUNTER — Other Ambulatory Visit: Payer: Self-pay

## 2021-02-23 ENCOUNTER — Ambulatory Visit: Payer: Medicare PPO | Admitting: Family Medicine

## 2021-02-23 ENCOUNTER — Encounter: Payer: Self-pay | Admitting: Family Medicine

## 2021-02-23 VITALS — BP 130/80 | HR 65 | Temp 98.1°F | Ht 74.0 in | Wt 233.0 lb

## 2021-02-23 DIAGNOSIS — E7800 Pure hypercholesterolemia, unspecified: Secondary | ICD-10-CM | POA: Diagnosis not present

## 2021-02-23 DIAGNOSIS — I10 Essential (primary) hypertension: Secondary | ICD-10-CM | POA: Diagnosis not present

## 2021-02-23 DIAGNOSIS — F419 Anxiety disorder, unspecified: Secondary | ICD-10-CM | POA: Insufficient documentation

## 2021-02-23 DIAGNOSIS — F17219 Nicotine dependence, cigarettes, with unspecified nicotine-induced disorders: Secondary | ICD-10-CM | POA: Diagnosis not present

## 2021-02-23 DIAGNOSIS — Z8673 Personal history of transient ischemic attack (TIA), and cerebral infarction without residual deficits: Secondary | ICD-10-CM | POA: Diagnosis not present

## 2021-02-23 DIAGNOSIS — Z125 Encounter for screening for malignant neoplasm of prostate: Secondary | ICD-10-CM | POA: Diagnosis not present

## 2021-02-23 DIAGNOSIS — K439 Ventral hernia without obstruction or gangrene: Secondary | ICD-10-CM | POA: Diagnosis not present

## 2021-02-23 MED ORDER — VARENICLINE TARTRATE 0.5 MG X 11 & 1 MG X 42 PO TBPK: ORAL_TABLET | ORAL | 0 refills | Status: DC

## 2021-02-23 NOTE — Patient Instructions (Signed)
Nice to see you. Please start the Chantix 1 week prior to your quit date. General surgery should call you to schedule an appointment to evaluate your hernia.

## 2021-02-23 NOTE — Progress Notes (Signed)
[name], MD Phone: [phone]  Juan Logan is a 65 y.o. male who presents today for f/u.  HYPERTENSION Disease Monitoring Home BP Monitoring not checking  Chest pain- no    Dyspnea- no Medications Compliance-  no medications.  Edema- no BMET    Component Value Date/Time   NA 137 07/07/2020 1122   NA 143 11/01/2019 1200   K 4.3 07/07/2020 1122   CL 101 07/07/2020 1122   CO2 29 07/07/2020 1122   GLUCOSE 101 (H) 07/07/2020 1122   BUN 11 07/07/2020 1122   BUN 9 11/01/2019 1200   CREATININE 0.96 07/07/2020 1122   CALCIUM 9.6 07/07/2020 1122   GFRNONAA 78 11/01/2019 1200   GFRAA 90 11/01/2019 1200   HYPERLIPIDEMIA Symptoms Chest pain on exertion:  no    Medications: Compliance- taking lipitor Right upper quadrant pain- no  Muscle aches- no Lipid Panel     Component Value Date/Time   CHOL 157 06/26/2020 1029   TRIG 94.0 06/26/2020 1029   HDL 30.70 (L) 06/26/2020 1029   CHOLHDL 5 06/26/2020 1029   VLDL 18.8 06/26/2020 1029   LDLCALC 108 (H) 06/26/2020 1029   LDLDIRECT 82.0 07/28/2020 0848   History of stroke: The patient and his sister both note that his temper has progressively improved since having his stroke.  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  Ventral hernia: Patient notes that he had a surgery on this previously.  It has started to bulge out again.  There is no associated pain.  He typically has a bowel movement most days still can go several days without 1.  He does pass gas.  Anxiety: His sister reports the patient has anxiety though the patient notes he does not have any significant issues with this.  He notes no depression.  No SI.  He does not really want to start on medication.  Tobacco abuse: He is smoking half a pack per day.  He is ready to quit.  He notes he quit for 5 days previously though has not had any other quit attempts.  No history of seizures.  Perfect  thank  Social History   Tobacco Use  Smoking Status Every Day   Types: Cigarettes  Smokeless Tobacco Never  Tobacco Comments   Patient smoked 3 packs/day at the most, started smoking at age 81, now smoking 4 to 5 cigarettes daily    Current Outpatient Medications on File Prior to Visit  Medication Sig Dispense Refill   aspirin EC 81 MG EC tablet Take 1 tablet (81 mg total) by mouth daily. Swallow whole. 30 tablet 11   atorvastatin (LIPITOR) 20 MG tablet Take 1 tablet (20 mg total) by mouth daily. 90 tablet 3   No current facility-administered medications on file prior to visit.     ROS see history of present illness  Objective  Physical Exam Vitals:   02/23/21 1122 02/23/21 1148  BP: 140/80 130/80  Pulse: 67 65  Temp: 98.1 F (36.7 C)   SpO2: 98%     BP Readings from Last 3 Encounters:  02/23/21 130/80  09/25/20 130/70  06/26/20 138/76   Wt Readings from Last 3 Encounters:  02/23/21 233 lb (105.7 kg)  09/25/20 231 lb 6.4 oz (105 kg)  07/26/20 232 lb (105.2 kg)    Physical Exam Constitutional:      General: He is not  in acute distress.    Appearance: He is not diaphoretic.  Cardiovascular:     Rate and Rhythm: Normal rate and regular rhythm.     Heart sounds: Normal heart sounds.  Pulmonary:     Effort: Pulmonary effort is normal.     Breath sounds: Normal breath sounds.  Abdominal:     General: There is no distension.     Tenderness: There is no abdominal tenderness.     Hernia: A hernia (Ventral hernia noted) is present.  Skin:    General: Skin is warm and dry.  Neurological:     Mental Status: He is alert.     Assessment/Plan: Please see individual problem list.  Problem List Items Addressed This Visit     Anxiety    Mild anxiety.  The patient does not want medication.  I am not sure therapy would be beneficial either given how mild it is.  We will continue to monitor.      History of CVA (cerebrovascular accident)    Continues to  progressively recover.  He continues on aspirin.  He continues on Lipitor.  Blood pressure seems to be adequately controlled.  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      Relevant Orders   Lipid panel   Comp Met (CMET)   HgB A1c   Hypertension    Adequate control off of medication.  We will continue to monitor this.      Relevant Orders   Lipid panel   Comp Met (CMET)   HgB A1c   Nicotine dependence, cigarettes, w unsp disorders    The patient is ready to quit smoking.  We will trial Chantix.  Advised to monitor for abnormal dreams, worsening anxiety or depression.  Discussed picking a quit date and starting the Chantix a week or so ahead of that.      Relevant Medications   varenicline (CHANTIX PAK) 0.5 MG X 11 & 1 MG X 42 tablet   Pure hypercholesterolemia    Continue Lipitor 20 mg once daily.      Relevant Orders   Lipid panel   Comp Met (CMET)   HgB A1c   Ventral hernia    Will refer to general surgery to get their input on whether or not he could benefit from a surgical procedure for this.      Relevant Orders   Ambulatory referral to General Surgery   Other Visit Diagnoses     Prostate cancer screening    -  Primary   Relevant Orders   PSA, Medicare ( [place] only)       Return in about 5 months (around 07/24/2021) for Hyperlipidemia, labs 2 days at a time.  This visit occurred during the SARS-CoV-2 public health emergency.  Safety protocols were in place, including screening questions prior to the visit, additional usage of staff PPE, and extensive cleaning of exam room while observing appropriate contact time as indicated for disinfecting solutions.    [name], MD [place]

## 2021-02-23 NOTE — Assessment & Plan Note (Signed)
Continues to progressively recover.  He continues on aspirin.  He continues on Lipitor.  Blood pressure seems to be adequately controlled.  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

## 2021-02-23 NOTE — Assessment & Plan Note (Signed)
Continue Lipitor 20 mg once daily. 

## 2021-02-23 NOTE — Assessment & Plan Note (Signed)
The patient is ready to quit smoking.  We will trial Chantix.  Advised to monitor for abnormal dreams, worsening anxiety or depression.  Discussed picking a quit date and starting the Chantix a week or so ahead of that.

## 2021-02-23 NOTE — Assessment & Plan Note (Signed)
Mild anxiety.  The patient does not want medication.  I am not sure therapy would be beneficial either given how mild it is.  We will continue to monitor.

## 2021-02-23 NOTE — Assessment & Plan Note (Signed)
Adequate control off of medication.  We will continue to monitor this.

## 2021-02-23 NOTE — Assessment & Plan Note (Signed)
Will refer to general surgery to get their input on whether or not he could benefit from a surgical procedure for this.

## 2021-03-12 ENCOUNTER — Ambulatory Visit: Payer: Medicare PPO | Admitting: Surgery

## 2021-03-16 ENCOUNTER — Ambulatory Visit: Payer: Medicare PPO | Admitting: Surgery

## 2021-03-23 ENCOUNTER — Encounter: Payer: Self-pay | Admitting: *Deleted

## 2021-03-28 ENCOUNTER — Ambulatory Visit: Payer: Medicare PPO | Admitting: Family Medicine

## 2021-07-23 ENCOUNTER — Other Ambulatory Visit (INDEPENDENT_AMBULATORY_CARE_PROVIDER_SITE_OTHER): Payer: Medicare PPO

## 2021-07-23 DIAGNOSIS — E7800 Pure hypercholesterolemia, unspecified: Secondary | ICD-10-CM

## 2021-07-23 DIAGNOSIS — Z125 Encounter for screening for malignant neoplasm of prostate: Secondary | ICD-10-CM

## 2021-07-23 DIAGNOSIS — I10 Essential (primary) hypertension: Secondary | ICD-10-CM

## 2021-07-23 DIAGNOSIS — Z8673 Personal history of transient ischemic attack (TIA), and cerebral infarction without residual deficits: Secondary | ICD-10-CM

## 2021-07-23 LAB — COMPREHENSIVE METABOLIC PANEL
ALT: 10 U/L (ref 0–53)
AST: 11 U/L (ref 0–37)
Albumin: 3.9 g/dL (ref 3.5–5.2)
Alkaline Phosphatase: 73 U/L (ref 39–117)
BUN: 14 mg/dL (ref 6–23)
CO2: 27 mEq/L (ref 19–32)
Calcium: 9.3 mg/dL (ref 8.4–10.5)
Chloride: 105 mEq/L (ref 96–112)
Creatinine, Ser: 1.17 mg/dL (ref 0.40–1.50)
GFR: 64.72 mL/min (ref >60.00)
Glucose, Bld: 110 mg/dL — ABNORMAL HIGH (ref 70–99)
Potassium: 4.4 mEq/L (ref 3.5–5.1)
Sodium: 140 mEq/L (ref 135–145)
Total Bilirubin: 0.4 mg/dL (ref 0.2–1.2)
Total Protein: 7.1 g/dL (ref 6.0–8.3)

## 2021-07-23 LAB — PSA, MEDICARE: PSA: 0.87 ng/ml (ref 0.10–4.00)

## 2021-07-23 LAB — LIPID PANEL
Cholesterol: 180 mg/dL (ref 0–200)
HDL: 30.3 mg/dL — ABNORMAL LOW (ref >39.00)
LDL Cholesterol: 120 mg/dL — ABNORMAL HIGH (ref 0–99)
NonHDL: 149.63
Total CHOL/HDL Ratio: 6
Triglycerides: 149 mg/dL (ref 0.0–149.0)
VLDL: 29.8 mg/dL (ref 0.0–40.0)

## 2021-07-23 LAB — HEMOGLOBIN A1C: Hgb A1c MFr Bld: 5.1 % (ref 4.6–6.5)

## 2021-07-25 ENCOUNTER — Ambulatory Visit: Payer: Medicare PPO | Admitting: Family Medicine

## 2021-07-25 ENCOUNTER — Encounter: Payer: Self-pay | Admitting: Family Medicine

## 2021-07-25 DIAGNOSIS — I10 Essential (primary) hypertension: Secondary | ICD-10-CM | POA: Diagnosis not present

## 2021-07-25 DIAGNOSIS — Z8673 Personal history of transient ischemic attack (TIA), and cerebral infarction without residual deficits: Secondary | ICD-10-CM

## 2021-07-25 DIAGNOSIS — E7800 Pure hypercholesterolemia, unspecified: Secondary | ICD-10-CM | POA: Diagnosis not present

## 2021-07-25 DIAGNOSIS — F17219 Nicotine dependence, cigarettes, with unspecified nicotine-induced disorders: Secondary | ICD-10-CM

## 2021-07-25 MED ORDER — REPATHA SURECLICK 140 MG/ML ~~LOC~~ SOAJ
140.0000 mg | SUBCUTANEOUS | 1 refills | Status: DC
Start: 2021-07-25 — End: 2022-01-29

## 2021-07-25 NOTE — Assessment & Plan Note (Signed)
Adequate control off of medication.  We will continue to monitor. ?

## 2021-07-25 NOTE — Assessment & Plan Note (Signed)
Smoking cessation counseling provided.  He will contact the 1 800 quit NOW line to see about nicotine patches given the cost of Chantix being excessive. ?

## 2021-07-25 NOTE — Assessment & Plan Note (Addendum)
Unable to tolerate Lipitor given suicidal ideation.  That has resolved and he notes no depression. He was advised to seek medical attention immediately if he has recurrent SI.  Discussed that statins would not be an option moving forward given that side effect.  We will try to get Repatha approved for him.  He will start Repatha 140 mg every 14 days. ?

## 2021-07-25 NOTE — Progress Notes (Signed)
?Juan Rumps, MD ?Phone: [phone] ? ?Juan Logan is a 66 y.o. male who presents today for f/u. ? ?HYPERLIPIDEMIA ?Symptoms ?Chest pain on exertion:  no   Leg claudication:   no ?Medications: ?Compliance- not on medication, he had to stop the lipitor due to SI, notes this resolved after stopping the lipitor and has not recurred, no depression ?Lipid Panel  ?   ?Component Value Date/Time  ? CHOL 180 07/23/2021 0732  ? TRIG 149.0 07/23/2021 0732  ? HDL 30.30 (L) 07/23/2021 0732  ? CHOLHDL 6 07/23/2021 0732  ? VLDL 29.8 07/23/2021 0732  ? [place] 120 (H) 07/23/2021 0732  ? LDLDIRECT 82.0 07/28/2020 0848  ? ?HYPERTENSION ?Disease Monitoring ?Chest pain- no    Dyspnea- no ?Medications ?Compliance-  no medications.  Edema- no ?BMET ?   ?Component Value Date/Time  ? NA 140 07/23/2021 0732  ? NA 143 11/01/2019 1200  ? K 4.4 07/23/2021 0732  ? CL 105 07/23/2021 0732  ? CO2 27 07/23/2021 0732  ? GLUCOSE 110 (H) 07/23/2021 0732  ? BUN 14 07/23/2021 0732  ? BUN 9 11/01/2019 1200  ? CREATININE 1.17 07/23/2021 0732  ? CALCIUM 9.3 07/23/2021 0732  ? GFRNONAA 78 11/01/2019 1200  ? GFRAA 90 11/01/2019 1200  ? ?History of CVA: Patient notes this has improved significantly.  He notes his thought processes and speech are significantly better. ? ?Nicotine dependence: He continues to smoke.  He wants to quit.  The Chantix was too expensive. ? ?Social History  ? ?Tobacco Use  ?Smoking Status Every Day  ? Types: Cigarettes  ?Smokeless Tobacco Never  ?Tobacco Comments  ? Patient smoked 3 packs/day at the most, started smoking at age 54, now smoking 4 to 5 cigarettes daily  ? ? ?Current Outpatient Medications on File Prior to Visit  ?Medication Sig Dispense Refill  ? aspirin EC 81 MG EC tablet Take 1 tablet (81 mg total) by mouth daily. Swallow whole. 30 tablet 11  ? ?No current facility-administered medications on file prior to visit.  ? ? ? ?ROS see history of present illness ? ?Objective ? ?Physical Exam ?Vitals:  ? 07/25/21 1035   ?BP: 130/70  ?Pulse: 75  ?Temp: 98 ?F (36.7 ?C)  ?SpO2: 97%  ? ? ?BP Readings from Last 3 Encounters:  ?07/25/21 130/70  ?02/23/21 130/80  ?09/25/20 130/70  ? ?Wt Readings from Last 3 Encounters:  ?07/25/21 241 lb 1.6 oz (109.4 kg)  ?02/23/21 233 lb (105.7 kg)  ?09/25/20 231 lb 6.4 oz (105 kg)  ? ? ?Physical Exam ?Constitutional:   ?   General: He is not in acute distress. ?   Appearance: He is not diaphoretic.  ?Cardiovascular:  ?   Rate and Rhythm: Normal rate and regular rhythm.  ?   Heart sounds: Normal heart sounds.  ?Pulmonary:  ?   Effort: Pulmonary effort is normal.  ?   Breath sounds: Normal breath sounds.  ?Musculoskeletal:  ?   Right lower leg: No edema.  ?   Left lower leg: No edema.  ?Skin: ?   General: Skin is warm and dry.  ?Neurological:  ?   Mental Status: He is alert.  ?Psychiatric:     ?   Mood and Affect: Mood normal.  ? ? ? ?Assessment/Plan: Please see individual problem list. ? ?Problem List Items Addressed This Visit   ? ? History of CVA (cerebrovascular accident) (Chronic)  ?  Reports he is significantly better.  Discussed the need for adequate lipid  control.  He will continue aspirin 81 mg daily. ? ?  ?  ? Relevant Medications  ? Evolocumab (REPATHA SURECLICK) XX123456 MG/ML SOAJ  ? Hypertension (Chronic)  ?  Adequate control off of medication.  We will continue to monitor. ? ?  ?  ? Relevant Medications  ? Evolocumab (REPATHA SURECLICK) XX123456 MG/ML SOAJ  ? Nicotine dependence, cigarettes, w unsp disorders (Chronic)  ?  Smoking cessation counseling provided.  He will contact the 1 800 quit NOW line to see about nicotine patches given the cost of Chantix being excessive. ? ?  ?  ? Relevant Orders  ? Ambulatory Referral for Lung Cancer Scre  ? Pure hypercholesterolemia (Chronic)  ?  Unable to tolerate Lipitor given suicidal ideation.  That has resolved and he notes no depression. He was advised to seek medical attention immediately if he has recurrent SI.  Discussed that statins would not be an  option moving forward given that side effect.  We will try to get Repatha approved for him.  He will start Repatha 140 mg every 14 days. ? ?  ?  ? Relevant Medications  ? Evolocumab (REPATHA SURECLICK) XX123456 MG/ML SOAJ  ? ? ? ?Health Maintenance: lung cancer screening referral placed.  ? ?Return in about 6 months (around 01/25/2022) for Hyperlipidemia. ? ? ?Juan Rumps, MD ?[place] ? ?

## 2021-07-25 NOTE — Patient Instructions (Signed)
Nice to see you. ?Please call [phone] for help with smoking cessation. ?Please let us know if the Repatha is excessively expensive. ?

## 2021-07-25 NOTE — Assessment & Plan Note (Signed)
Reports he is significantly better.  Discussed the need for adequate lipid control.  He will continue aspirin 81 mg daily. ?

## 2021-07-30 ENCOUNTER — Ambulatory Visit (INDEPENDENT_AMBULATORY_CARE_PROVIDER_SITE_OTHER): Payer: Medicare PPO

## 2021-07-30 VITALS — Ht 74.0 in | Wt 241.0 lb

## 2021-07-30 DIAGNOSIS — Z0000 Encounter for general adult medical examination without abnormal findings: Secondary | ICD-10-CM

## 2021-07-30 NOTE — Progress Notes (Addendum)
Subjective:   Juan Logan is a 66 y.o. male who presents for Medicare Annual/Subsequent preventive examination.  Review of Systems    No ROS.  Medicare Wellness Virtual Visit.  Visual/audio telehealth visit, UTA vital signs.   See social history for additional risk factors.   Cardiac Risk Factors include: advanced age (>51men, >19 women);male gender;hypertension;smoking/ tobacco exposure     Objective:    Today's Vitals   07/30/21 0837  Weight: 241 lb (109.3 kg)  Height: 6\' 2"  (1.88 m)   Body mass index is 30.94 kg/m.     07/30/2021    8:38 AM 07/26/2020    8:41 AM 10/15/2019    3:00 PM  Advanced Directives  Does Patient Have a Medical Advance Directive? No No No  Would patient like information on creating a medical advance directive? No - Patient declined No - Patient declined No - Patient declined    Current Medications (verified) Outpatient Encounter Medications as of 07/30/2021  Medication Sig   aspirin EC 81 MG EC tablet Take 1 tablet (81 mg total) by mouth daily. Swallow whole.   Evolocumab (REPATHA SURECLICK) [id number] MG/ML SOAJ Inject 140 mg into the skin every 14 (fourteen) days. (Patient not taking: Reported on 07/30/2021)   No facility-administered encounter medications on file as of 07/30/2021.    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   Hernia of abdominal wall    Stroke ([place]) 10/18/2019   ischemic   Past Surgical History:  Procedure Laterality Date   IR CT HEAD LTD  10/15/2019   IR INTRAVSC STENT CERV CAROTID W/EMB-PROT MOD SED INCL ANGIO  10/15/2019   IR PERCUTANEOUS ART THROMBECTOMY/INFUSION INTRACRANIAL INC DIAG ANGIO  10/15/2019       IR PERCUTANEOUS ART THROMBECTOMY/INFUSION INTRACRANIAL INC DIAG ANGIO  10/15/2019   RADIOLOGY WITH ANESTHESIA N/A 10/15/2019   Procedure: IR WITH ANESTHESIA;  Surgeon: Radiologist, Medication, MD;  Location: [facility];   Service: Radiology;  Laterality: N/A;   Family History  Problem Relation Age of Onset   Hypertension Mother    Stroke Mother    Hypertension Father    Prostate cancer Father    Social History   Socioeconomic History   Marital status: Widowed    Spouse name: Vicky   Number of children: 1   Years of education: Not on file   Highest education level: Associate degree: academic program  Occupational History   Occupation: retired  Tobacco Use   Smoking status: Every Day    Types: Cigarettes   Smokeless tobacco: Never   Tobacco comments:    Patient smoked 3 packs/day at the most, started smoking at age 40, now smoking 10 cigarettes daily  Vaping Use   Vaping Use: Never used  Substance and Sexual Activity   Alcohol use: Never   Drug use: Never   Sexual activity: Not on file  Other Topics Concern   Not on file  Social History Narrative   Widowed   Right Handed   Drinks 9 cups of caffeine   Social Determinants of Health   Financial Resource Strain: Low Risk    Difficulty of Paying Living Expenses: Not hard at all  Food Insecurity: No Food Insecurity   Worried About Charity fundraiser in the Last Year: Never true   [employer] in the Last Year: Never true  Transportation Needs: No Transportation Needs   Lack  of Transportation (Medical): No   Lack of Transportation (Non-Medical): No  Physical Activity: Not on file  Stress: No Stress Concern Present   Feeling of Stress : Not at all  Social Connections: Unknown   Frequency of Communication with Friends and Family: More than three times a week   Frequency of Social Gatherings with Friends and Family: More than three times a week   Attends Religious Services: Not on file   Active Member of Clubs or Organizations: Not on file   Attends Archivist Meetings: Not on file   Marital Status: Widowed    Tobacco Counseling Ready to quit: Not Answered Counseling given: Not Answered Tobacco comments: Patient smoked  3 packs/day at the most, started smoking at age 27, now smoking 10 cigarettes daily   Clinical Intake:  Pre-visit preparation completed: Yes        Diabetes: No  How often do you need to have someone help you when you read instructions, pamphlets, or other written materials from your doctor or pharmacy?: 1 - Never   Interpreter Needed?: No     Activities of Daily Living    07/30/2021    8:39 AM  In your present state of health, do you have any difficulty performing the following activities:  Hearing? 0  Vision? 0  Difficulty concentrating or making decisions? 0  Walking or climbing stairs? 0  Dressing or bathing? 0  Doing errands, shopping? 0  Preparing Food and eating ? N  Using the Toilet? N  In the past six months, have you accidently leaked urine? N  Do you have problems with loss of bowel control? N  Managing your Medications? N  Managing your Finances? N  Housekeeping or managing your Housekeeping? N    Patient Care Team: Leone Haven, MD as PCP - General (Family Medicine)  Indicate any recent Medical Services you may have received from other than [facility] providers in the past year (date may be approximate).     Assessment:   This is a routine wellness examination for Juan Logan.  Virtual Visit via Telephone Note  I connected with  Juan Logan on 07/30/21 at  8:30 AM EDT by telephone and verified that I am speaking with the correct person using two identifiers.  Persons participating in the virtual visit: patient/Nurse Health Advisor   I discussed the limitations of performing an evaluation and management service by telehealth. We continued and completed visit with audio only. Some vital signs may be absent or patient reported.   Hearing/Vision screen Hearing Screening - Comments:: Patient is able to hear conversational tones without difficulty.  No issues reported. Vision Screening - Comments:: Wears corrective lenses     Dietary issues and exercise  activities discussed: Current Exercise Habits: Home exercise routine, Type of exercise: walking, Intensity: Mild Regular diet   Goals Addressed               This Visit's Progress     Patient Stated     Quit Smoking (pt-stated)        Smoke less per day until stopping       Depression Screen    07/30/2021    8:39 AM 07/25/2021   10:40 AM 02/23/2021   11:59 AM 07/26/2020    8:39 AM 06/26/2020    9:59 AM 11/01/2019   11:20 AM  PHQ 2/9 Scores  PHQ - 2 Score 0 0 1 0 0 0    Fall Risk    07/30/2021  8:39 AM 07/25/2021   10:40 AM 07/26/2020    8:42 AM 06/26/2020    9:59 AM 11/01/2019   11:21 AM  Fall Risk   Falls in the past year? 0 0 0 0 0  Number falls in past yr: 0 0 0  0  Injury with Fall?  0 0  0  Risk for fall due to : No Fall Risks No Fall Risks     Follow up Falls evaluation completed Falls evaluation completed Falls evaluation completed      [place]: Home free of loose throw rugs in walkways, pet beds, electrical cords, etc? Yes  Adequate lighting in your home to reduce risk of falls? Yes   ASSISTIVE DEVICES UTILIZED TO PREVENT FALLS: Life alert? No  Use of a cane, walker or w/c? No   TIMED UP AND GO: Was the test performed? No .   Cognitive Function:  Patient is alert and oriented x3.       07/30/2021    9:03 AM 07/26/2020    8:43 AM  6CIT Screen  What Year? 0 points 0 points  What month? 0 points 0 points  What time? 0 points 0 points  Count back from 20 0 points 0 points  Months in reverse 0 points 0 points  Repeat phrase 0 points   Total Score 0 points     Immunizations  There is no immunization history on file for this patient.  Tdap ,Shingrix, Pneumonia vaccine- discontinued per patient.   Screening Tests Health Maintenance  Topic Date Due   COLONOSCOPY (Pts 45-72yrs Insurance coverage will need to be confirmed)  Never done   INFLUENZA VACCINE  10/09/2021   HPV VACCINES  Aged Out   Pneumonia  Vaccine 32+ Years old  Discontinued   TETANUS/TDAP  Discontinued   COVID-19 Vaccine  Discontinued   Hepatitis C Screening  Discontinued   Zoster Vaccines- Shingrix  Discontinued   Health Maintenance Health Maintenance Due  Topic Date Due   COLONOSCOPY (Pts 45-21yrs Insurance coverage will need to be confirmed)  Never done   Colonoscopy- declined per patient.   Vision Screening: Recommended annual ophthalmology exams for early detection of glaucoma and other disorders of the eye. Dental Screening: Recommended annual dental exams for proper oral hygiene  Community Resource Referral / Chronic Care Management: CRR required this visit?  No   CCM required this visit?  No      Plan:   Keep all routine maintenance appointments.   I have personally reviewed and noted the following in the patient's chart:   Medical and social history Use of alcohol, tobacco or illicit drugs  Current medications and supplements including opioid prescriptions. Patient is not currently taking opioid prescriptions. Functional ability and status Nutritional status Physical activity Advanced directives List of other physicians Hospitalizations, surgeries, and ER visits in previous 12 months Vitals Screenings to include cognitive, depression, and falls Referrals and appointments  In addition, I have reviewed and discussed with patient certain preventive protocols, quality metrics, and best practice recommendations. A written personalized care plan for preventive services as well as general preventive health recommendations were provided to patient.     Varney Biles, LPN   [id number]

## 2021-07-30 NOTE — Patient Instructions (Addendum)
  Juan Logan , Thank you for taking time to come for your Medicare Wellness Visit. I appreciate your ongoing commitment to your health goals. Please review the following plan we discussed and let me know if I can assist you in the future.   These are the goals we discussed:  Goals       Patient Stated     Quit Smoking (pt-stated)      Smoke less per day until stopping        This is a list of the screening recommended for you and due dates:  Health Maintenance  Topic Date Due   Colon Cancer Screening  Never done   Flu Shot  10/09/2021   HPV Vaccine  Aged Out   Pneumonia Vaccine  Discontinued   Tetanus Vaccine  Discontinued   COVID-19 Vaccine  Discontinued   Hepatitis C Screening: USPSTF Recommendation to screen - Ages 101-79 yo.  Discontinued   Zoster (Shingles) Vaccine  Discontinued

## 2021-12-22 IMAGING — XA IR PERCUTANEOUS ART THORMBECTOMY/INFUSION INTRACRANIAL INCLUDE D
1 series · 15 of 24 positions shown · non-contrast
Comparison: CT/CT angiogram of the head and neck October 15, 2019

INDICATION: 64-year-old male with no known significant past medical history
presenting with aphasia and hemianopia, NIHSS 6; premorbid Shatha
Partin 0. He was last known well at 11 p.m. on 10/14/2019. No IV tPA
given as patient was outside the window. Head CT showed hypodense
left insula and left temporal lobe with no evidence of hemorrhage.
CT angiogram of the head and neck showed occlusion of the left ICA
at the bulb with tandem occlusion of the ICA terminus extending into
the M1 segment. CT perfusion showed a core infarct of 61 mL with a
penumbra of 135 mL. He was brought to our service for a diagnostic
cerebral angiogram and mechanical thrombectomy with possible carotid
stenting.

EXAM:
DIAGNOSTIC CEREBRAL ANGIOGRAM
MECHANICAL THROMBECTOMY
LEFT CAROTID ARTERY STENTING WITH CEREBRAL PROTECTION DEVICE
TECHNIQUE: Informed written consent was obtained from the patient's sister
after a thorough discussion of the procedural risks, benefits and
alternatives. All questions were addressed. Maximal Sterile Barrier
Technique was utilized including caps, mask, sterile gowns, sterile
gloves, sterile drape, hand hygiene and skin antiseptic. A timeout
was performed prior to the initiation of the procedure.

[Series 10: axial · oblique · 5.0mm · 0.44mm/px · 15 of 39 slices shown]
[im 1/39]
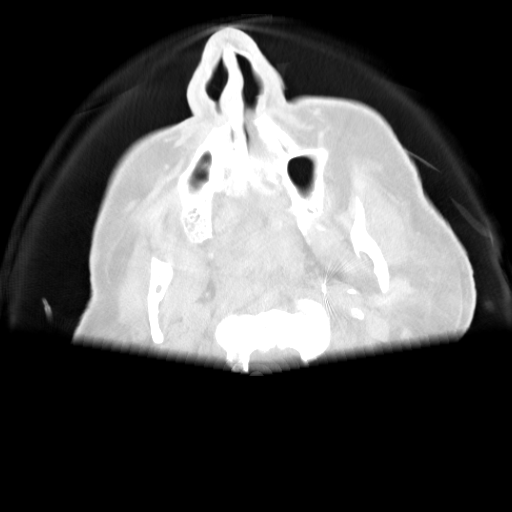
[im 4/39]
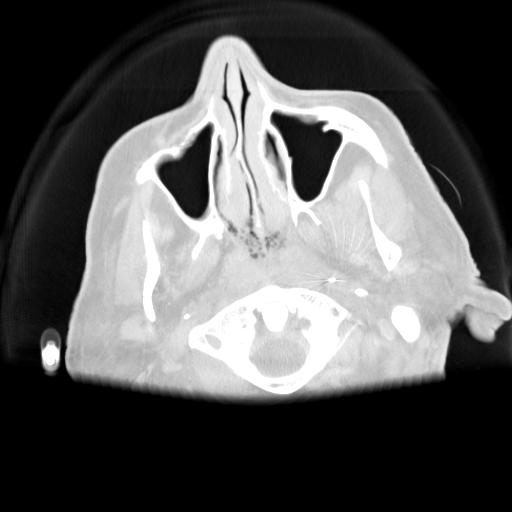
[im 7/39]
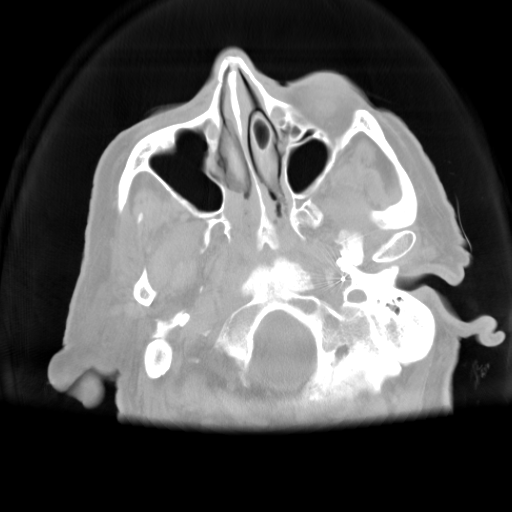
[im 9/39]
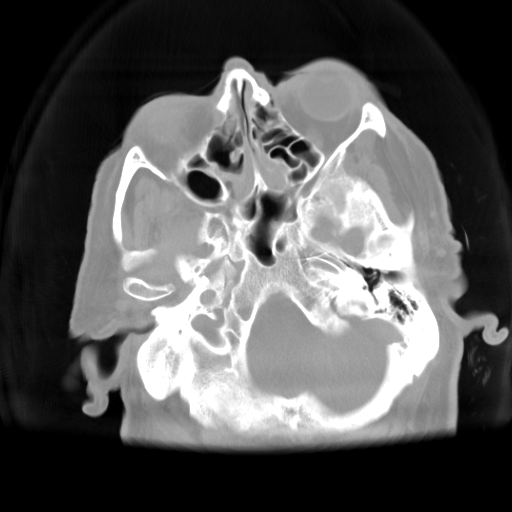
[im 12/39]
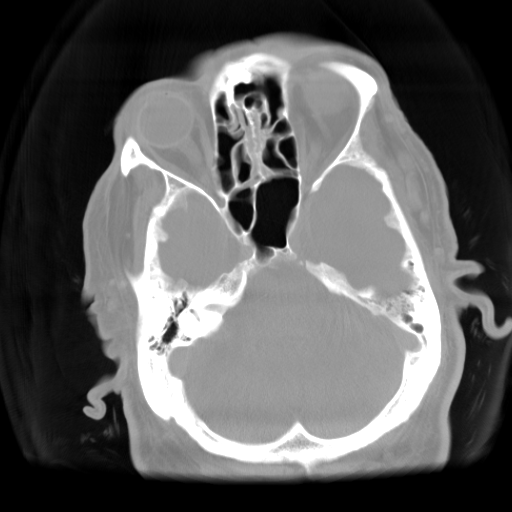
[im 14/39]
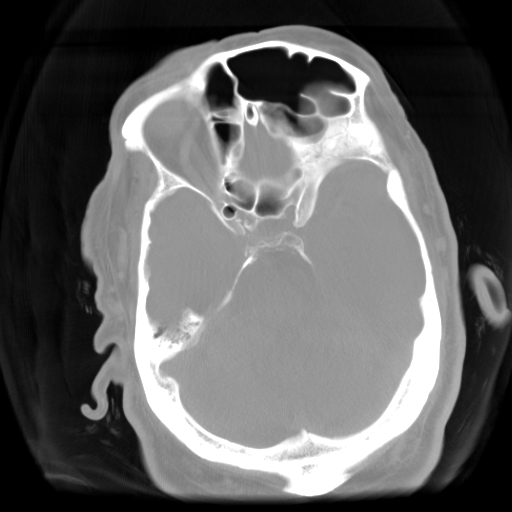
[im 17/39]
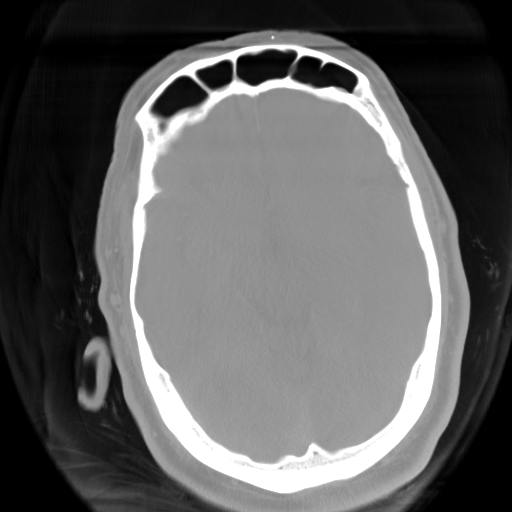
[im 20/39]
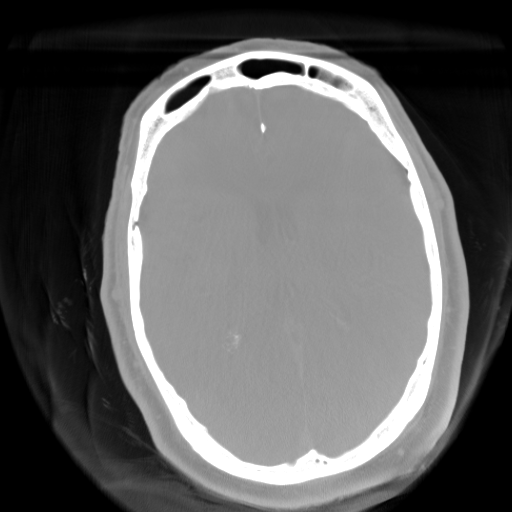
[im 22/39]
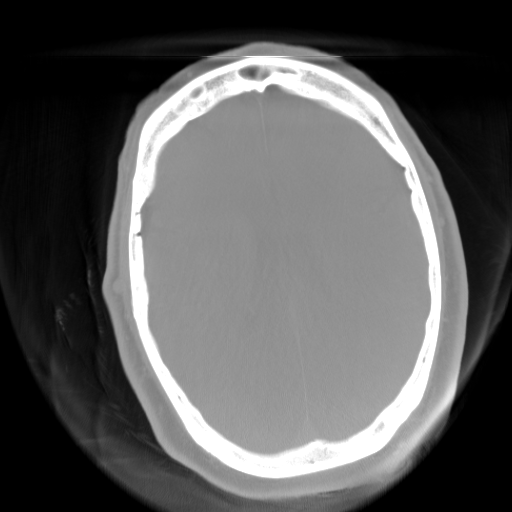
[im 25/39]
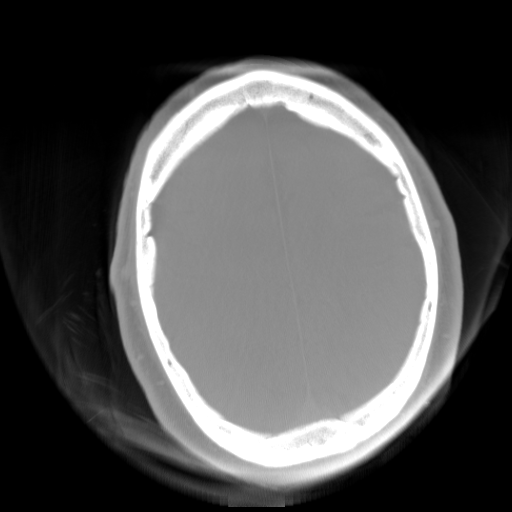
[im 27/39]
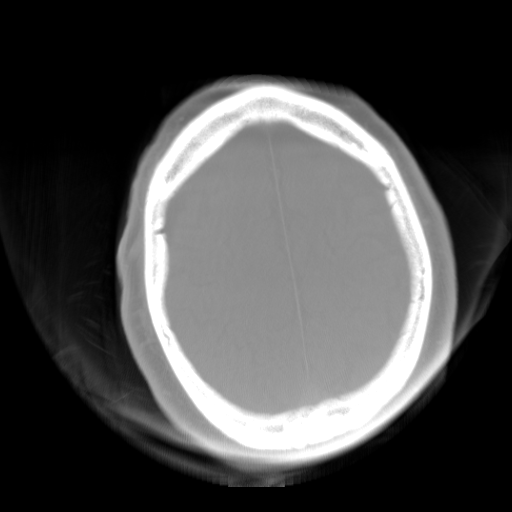
[im 30/39]
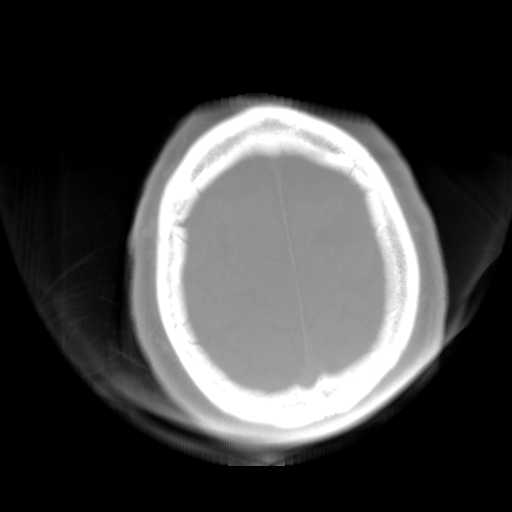
[im 34/39]
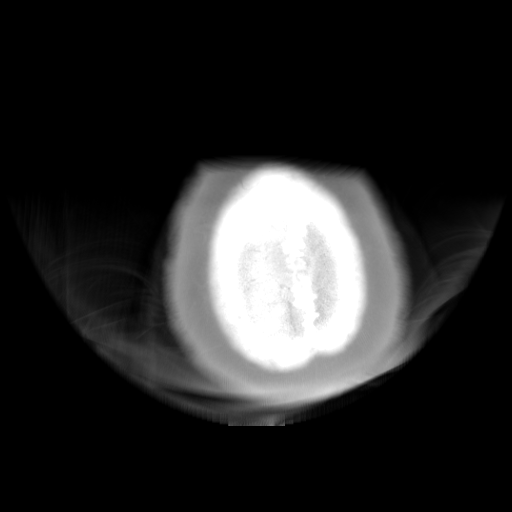
[im 35/39]
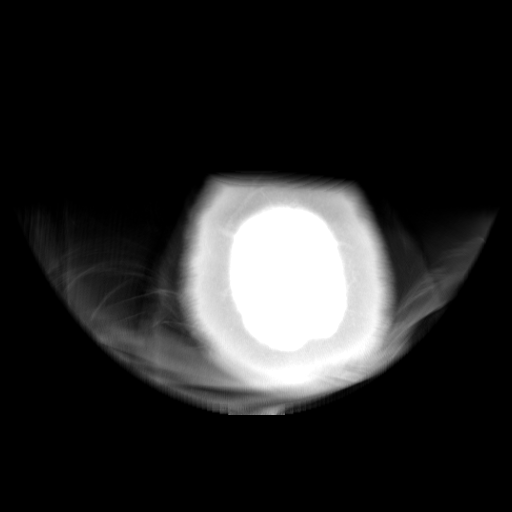
[im 39/39  full-range]
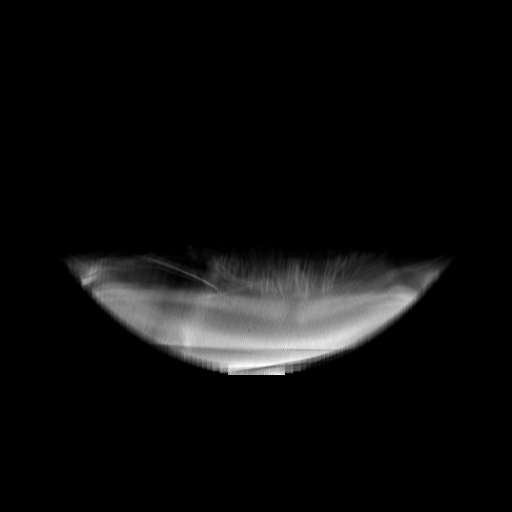

[15 of 24 positions shown; findings below may reference images not displayed]

MEDICATIONS:
Intra arterial bolus followed by infusion of cangrelor prior to
carotid stent placement.

ANESTHESIA/SEDATION:
The procedure was performed under general anesthesia

FLUOROSCOPY TIME:  Fluoroscopy Time: 38 minutes minutes 18 seconds
(1,101 mGy).

COMPLICATIONS:
None immediate.
The right groin was prepped and draped in the usual sterile fashion.
Using a micropuncture kit and the modified Seldinger technique,
access was gained to the right common femoral artery and an 8 French
sheath was placed.

Under fluoroscopy, an 8 French Walrus balloon guide catheter was
navigated over a 6 Xsolly Alanv 2 catheter and a 0.035" Terumo
Glidewire into the aortic arch. The catheter was placed into the
left common carotid artery and then advanced into the left external
carotid artery. Frontal and lateral views of the head were obtained.
The catheter was retracted into the left common carotid artery.
Frontal and lateral views of the neck were obtained.
FINDINGS: Left common carotid injection: Atherosclerotic changes at the
carotid bifurcation with complete occlusion of the left ICA at the
bulb. There is mild stenosis at the origin of the external carotid
artery with poststenotic dilatation.

Left ECA injection: Prominent Elisa artery anastomosis with
retrograde filling of the intracranial left ICA bile stomach artery
opacification of the distal cavernous and ophthalmic segment noting
occlusion at the terminus.

PROCEDURE:
Under biplane roadmap, attempt 10 navigation of a Vecta 71
aspiration catheter over a phenom 21 microcatheter and an aristotle
18 microguidewire into the cervical segment of the left ICA distal
to the proved unsuccessful. The microcatheter was successfully then
navigated over the wire through the occluded segment into the
petrous segment of the left ICA. The catheter was then further
navigated into the left M2/MCA posterior division branch. Next, a 6
x 40 mm solitaire stent retriever was deployed spanning the M1 and
proximal M2 segments. The device was allowed to intercalated with
the clot for 4 minutes. The aspiration catheter was connected to a
penumbra aspiration pump. The guiding catheter balloon was inflated.
The thrombectomy device and aspiration catheter were removed under
constant aspiration.

Follow-up left common carotid artery angiogram showed complete
intracranial recanalization. Severe stenosis at the left carotid
bulb is noted.

A 5 x 20 mm Viatrac balloon was navigated to the level of the left
carotid stenosis. Angioplasty was performed under fluoroscopy.
Follow-up angiogram showed significant improvement of the degree of
stenosis. Follow-up intracranial left ICA angiogram showed no
evidence of thromboembolic complication.

Delayed left ICA angiograms showed mild restenoses.

Flat panel CT of the head was obtained and post processed in a
separate workstation with concurrent attending physician
supervision. Selected images were sent to PACS. No evidence of
hemorrhagic complication seen.

Second delayed follow-up angiogram of the left ICA with frontal and
lateral views of the neck showed worsening of the degree of
stenosis.

At this point, patient received loading dose of cangrelor followed
by continuous infusion.

The 5 x 20 mm Viatrac balloon was again navigated to the level of
stenosis. Angioplasty was performed under the fluoroscopy.
Improvement of the stenotic lumen was noted.

Subsequently, and Emboshield nav6 4.0-7.0 cerebral protection device
was navigated to the distal cervical segment of the left ICA.
Subsequently, an 8-6 x 40 mm XACT carotid stent was deployed in the
left carotid bifurcation, across the stenosis. The cerebral
protection device was then recaptured. Follow-up left ICA angiograms
with frontal and lateral views of the head showed adequate stent
position with brisk anterograde flow and resolution of stenosis.

Follow-up left ICA angiograms with frontal and lateral views of the
head showed brisk opacification of the entire left anterior
circulation with no evidence of thromboembolic complication.

Delayed follow-up angiograms with frontal and lateral views of the
neck showed maintained patency of the recently deployed carotid
stent with brisk anterograde flow.

The catheter was subsequently withdrawn.

A right common femoral artery angiogram with right anterior oblique
and lateral views were obtained via sheath side port. The sheath was
exchanged over the wire for a Perclose ProGlide which was utilized
for access closure. Immediate hemostasis was achieved.
IMPRESSION: 1. Cervical left ICA occlusion at the the bulb with concurrent left
ICA T occlusion extending into the M1 segment successfully treated
with mechanical thrombectomy followed by left carotid bifurcation
angioplasty and stenting.
2. No thromboembolic or hemorrhagic complication

PLAN:
1. Dual anti-platelet therapy with aspirin and Brilinta.
2. Follow-up carotid duplex in 3 months with subsequent office
consult to evaluate for anti-platelet regimen.

## 2022-01-28 ENCOUNTER — Encounter: Payer: Self-pay | Admitting: Family Medicine

## 2022-01-28 ENCOUNTER — Ambulatory Visit (INDEPENDENT_AMBULATORY_CARE_PROVIDER_SITE_OTHER): Payer: Medicare PPO | Admitting: Family Medicine

## 2022-01-28 ENCOUNTER — Telehealth: Payer: Self-pay

## 2022-01-28 VITALS — BP 134/76 | HR 73 | Temp 97.6°F | Ht 74.0 in | Wt 244.4 lb

## 2022-01-28 DIAGNOSIS — F17219 Nicotine dependence, cigarettes, with unspecified nicotine-induced disorders: Secondary | ICD-10-CM

## 2022-01-28 DIAGNOSIS — E7800 Pure hypercholesterolemia, unspecified: Secondary | ICD-10-CM

## 2022-01-28 DIAGNOSIS — I10 Essential (primary) hypertension: Secondary | ICD-10-CM

## 2022-01-28 DIAGNOSIS — Z8673 Personal history of transient ischemic attack (TIA), and cerebral infarction without residual deficits: Secondary | ICD-10-CM

## 2022-01-28 NOTE — Assessment & Plan Note (Signed)
Smoking cessation counseling provided. Patient is not ready to quit. Will follow up at later visit.

## 2022-01-28 NOTE — Telephone Encounter (Signed)
Patient states he has thought about it and he is interested in taking the cholesterol medication injections.  Patient states he would like for [place] to call and let him know how to get started.

## 2022-01-28 NOTE — Assessment & Plan Note (Addendum)
Unable to tolerate Lipitor due to suicidal ideation. Recommended patient start Repatha but patient currently does not want to start medication at this time. Patient is agreeable with modifying diet to eat less red meat. Information given to patient regarding diet and lifestyle changes. Information given on repatha as well.

## 2022-01-28 NOTE — Assessment & Plan Note (Signed)
Currently stable off medication. Will continue to monitor.

## 2022-01-28 NOTE — Progress Notes (Signed)
Juan Alar, MD Phone: [phone]  Juan Logan is a 66 y.o. male who presents today for f/u.  Hyperlipidemia: patient reports that he is not taking anything currently. He previously had suicidal ideations on Lipitor despite cutting the dose in half. He states that he didn't realize how high his cholesterol is. He reports eating hot dogs and hamburgers frequently in his diet and he eats what his sisters cook for him. He denies chest pain, shortness of breath, and edema in his lower extremities.  Smoking cessation: patient smokes half a pack a day currently. He quit smoking for twenty years but started smoking again since 2015. He reports that he doesn't like this habit and has tried to quit with nicotine patches but has not succeeded in quitting. He has not taken any pharmacotherapy such as Wellbutrin or Chantix due to cost reasons. He also reports smoking more when his sisters are around.  Health maintenance: He declines lung cancer and colon cancer screening at this time, stating "I don't trust doctors." He reports that he has a blood pressure cuff at home but he does not currently check his blood pressure.    Social History   Tobacco Use  Smoking Status Every Day   Types: Cigarettes  Smokeless Tobacco Never  Tobacco Comments   Patient smoked 3 packs/day at the most, started smoking at age 58, now smoking 10 cigarettes daily    Current Outpatient Medications on File Prior to Visit  Medication Sig Dispense Refill   aspirin EC 81 MG EC tablet Take 1 tablet (81 mg total) by mouth daily. Swallow whole. 30 tablet 11   Evolocumab (REPATHA SURECLICK) 140 MG/ML SOAJ Inject 140 mg into the skin every 14 (fourteen) days. (Patient not taking: Reported on 01/28/2022) 6 mL 1   No current facility-administered medications on file prior to visit.     ROS see history of present illness  Objective  Vitals:   01/28/22 0955 01/28/22 1030  BP: 134/76 134/76  Pulse: 73   Temp: 97.6 F  (36.4 C)   SpO2: 98%     BP Readings from Last 3 Encounters:  01/28/22 134/76  07/25/21 130/70  02/23/21 130/80   Wt Readings from Last 3 Encounters:  01/28/22 244 lb 6.4 oz (110.9 kg)  07/30/21 241 lb (109.3 kg)  07/25/21 241 lb 1.6 oz (109.4 kg)    Physical Exam Constitutional:      Appearance: Normal appearance.  HENT:     Head: Normocephalic and atraumatic.  Cardiovascular:     Rate and Rhythm: Normal rate and regular rhythm.  Pulmonary:     Effort: Pulmonary effort is normal.     Breath sounds: Normal breath sounds.  Skin:    General: Skin is warm and dry.  Neurological:     Mental Status: He is alert.  Psychiatric:        Mood and Affect: Mood normal.      Assessment/Plan: Please see individual problem list.  Problem List Items Addressed This Visit     Hypertension - Primary (Chronic)    Currently stable off medication. Will continue to monitor.      Nicotine dependence, cigarettes, w unsp disorders (Chronic)    Smoking cessation counseling provided. Patient is not ready to quit. Will follow up at later visit.      Pure hypercholesterolemia (Chronic)    Unable to tolerate Lipitor due to suicidal ideation. Recommended patient start Repatha but patient currently does not want to start medication at this  time. Patient is agreeable with modifying diet to eat less red meat. Information given to patient regarding diet and lifestyle changes. Information given on repatha as well.       Health maintenance: Counseled patient on purpose of lung cancer screening and colon cancer screening.  Discussed that doing these allows to pick up on lesions when they are early in the process of growing that may prevent a future cancer or allow treatment as early as possible.  The patient understands the risk of missing something if we do not do this cancer screening.   Return in about 6 months (around 07/29/2022) for HLD.   Gae Gallop, Medical Student [facility] -  [facility]

## 2022-01-28 NOTE — Patient Instructions (Signed)
Nice to see you.  Please consider repatha for your cholesterol. Please work on diet as we discussed.   Preventing High Cholesterol Cholesterol is a white, waxy substance similar to fat that the human body needs to help build cells. The liver makes all the cholesterol that a person's body needs. Having high cholesterol (hypercholesterolemia) increases your risk for heart disease and stroke. Extra or excess cholesterol comes from the food that you eat. High cholesterol can often be prevented with diet and lifestyle changes. If you already have high cholesterol, you can control it with diet, lifestyle changes, and medicines. How can high cholesterol affect me? If you have high cholesterol, fatty deposits (plaques) may build up on the walls of your blood vessels. The blood vessels that carry blood away from your heart are called arteries. Plaques make the arteries narrower and stiffer. This in turn can: Restrict or block blood flow and cause blood clots to form. Increase your risk for heart attack and stroke. What can increase my risk for high cholesterol? This condition is more likely to develop in people who: Eat foods that are high in saturated fat or cholesterol. Saturated fat is mostly found in foods that come from animal sources. Are overweight. Are not getting enough exercise. Use products that contain nicotine or tobacco, such as cigarettes, e-cigarettes, and chewing tobacco. Have a family history of high cholesterol (familial hypercholesterolemia). What actions can I take to prevent this? Nutrition  Eat less saturated fat. Avoid trans fats (partially hydrogenated oils). These are often found in margarine and in some baked goods, fried foods, and snacks bought in packages. Avoid precooked or cured meat, such as bacon, sausages, or meat loaves. Avoid foods and drinks that have added sugars. Eat more fruits, vegetables, and whole grains. Choose healthy sources of protein, such as fish,  poultry, lean cuts of red meat, beans, peas, lentils, and nuts. Choose healthy sources of fat, such as: Nuts. Vegetable oils, especially olive oil. Fish that have healthy fats, such as omega-3 fatty acids. These fish include mackerel or salmon. Lifestyle Lose weight if you are overweight. Maintaining a healthy body mass index (BMI) can help prevent or control high cholesterol. It can also lower your risk for diabetes and high blood pressure. Ask your health care provider to help you with a diet and exercise plan to lose weight safely. Do not use any products that contain nicotine or tobacco. These products include cigarettes, chewing tobacco, and vaping devices, such as e-cigarettes. If you need help quitting, ask your health care provider. Alcohol use Do not drink alcohol if: Your health care provider tells you not to drink. You are pregnant, may be pregnant, or are planning to become pregnant. If you drink alcohol: Limit how much you have to: 0-1 drink a day for women. 0-2 drinks a day for men. Know how much alcohol is in your drink. In the [place] one drink equals one 12 oz bottle of beer (355 mL), one 5 oz glass of wine (148 mL), or one 1 oz glass of hard liquor (44 mL). Activity  Get enough exercise. Do exercises as told by your health care provider. Each week, do at least 150 minutes of exercise that takes a medium level of effort (moderate-intensity exercise). This kind of exercise: Makes your heart beat faster while allowing you to still be able to talk. Can be done in short sessions several times a day or longer sessions a few times a week. For example, on 5 days each  week, you could walk fast or ride your bike 3 times a day for 10 minutes each time. Medicines Your health care provider may recommend medicines to help lower cholesterol. This may be a medicine to lower the amount of cholesterol that your liver makes. You may need medicine if: Diet and lifestyle changes have not lowered  your cholesterol enough. You have high cholesterol and other risk factors for heart disease or stroke. Take over-the-counter and prescription medicines only as told by your health care provider. General information Manage your risk factors for high cholesterol. Talk with your health care provider about all your risk factors and how to lower your risk. Manage other conditions that you have, such as diabetes or high blood pressure (hypertension). Have blood tests to check your cholesterol levels at regular points in time as told by your health care provider. Keep all follow-up visits. This is important. Where to find more information American Heart Association: www.heart.org National Heart, Lung, and Blood Institute: https://wilson-eaton.com/ Summary High cholesterol increases your risk for heart disease and stroke. By keeping your cholesterol level low, you can reduce your risk for these conditions. High cholesterol can often be prevented with diet and lifestyle changes. Work with your health care provider to manage your risk factors, and have your blood tested regularly. This information is not intended to replace advice given to you by your health care provider. Make sure you discuss any questions you have with your health care provider. Document Revised: 09/28/2021 Document Reviewed: 05/01/2020 Elsevier Patient Education  [place] Injection What is this medication? EVOLOCUMAB (e voe LOK ue mab) treats high cholesterol. It may also be used to lower the risk of heart attack, stroke, and a type of heart surgery. It works by decreasing bad cholesterol (such as LDL) in your blood. It is a monoclonal antibody. Changes to diet and exercise are often combined with this medication. This medicine may be used for other purposes; ask your health care provider or pharmacist if you have questions. COMMON BRAND NAME(S): Repatha, Repatha SureClick What should I tell my care team before I  take this medication? They need to know if you have any of these conditions: An unusual or allergic reaction to evolocumab, latex, other medications, foods, dyes, or preservatives Pregnant or trying to get pregnant Breast-feeding How should I use this medication? This medication is injected under the skin. You will be taught how to prepare and give it. Take it as directed on the prescription label at the same time every day. Keep taking it unless your care team tells you to stop. It is important that you put your used needles and syringes in a special sharps container. Do not put them in a trash can. If you do not have a sharps container, call your pharmacist or care team to get one. This medication comes with INSTRUCTIONS FOR USE. Ask your pharmacist for directions on how to use this medication. Read the information carefully. Talk to your pharmacist or care team if you have questions. Talk to your care team about the use of this medication in children. While it may be prescribed for children as young as 10 years for selected conditions, precautions do apply. Overdosage: If you think you have taken too much of this medicine contact a poison control center or emergency room at once. NOTE: This medicine is only for you. Do not share this medicine with others. What if I miss a dose? It is important not to miss any  doses. Talk to your care team about what to do if you miss a dose. What may interact with this medication? Interactions are not expected. This list may not describe all possible interactions. Give your health care provider a list of all the medicines, herbs, non-prescription drugs, or dietary supplements you use. Also tell them if you smoke, drink alcohol, or use illegal drugs. Some items may interact with your medicine. What should I watch for while using this medication? Visit your care team for regular checks on your progress. Tell your care team if your symptoms do not start to get  better or if they get worse. You may need blood work while you are taking this medication. Do not wear the on-body infuser during an MRI. Taking this medication is only part of a total heart healthy program. Ask your care team if there are other changes you can make to improve your overall health. What side effects may I notice from receiving this medication? Side effects that you should report to your care team as soon as possible: Allergic reactions or angioedema--skin rash, itching or hives, swelling of the face, eyes, lips, tongue, arms, or legs, trouble swallowing or breathing Side effects that usually do not require medical attention (report to your care team if they continue or are bothersome): Back pain Flu-like symptoms--fever, chills, muscle pain, cough, headache, fatigue Pain, redness, or irritation at injection site Runny or stuffy nose Sore throat This list may not describe all possible side effects. Call your doctor for medical advice about side effects. You may report side effects to FDA at 1-800-FDA-1088. Where should I keep my medication? Keep out of the reach of children and pets. Store in a refrigerator or at room temperature between 20 and 25 degrees C (68 and 77 degrees F). Refrigeration (preferred): Store it in the refrigerator. Do not freeze. Keep it in the original carton until you are ready to take it. Remove the dose from the carton about 30 minutes before it is time for you to take it. Get rid of any unused medication after the expiration date. Room temperature: This medication may be stored at room temperature for up to 30 days. Keep it in the original carton until you are ready to take it. If it is stored at room temperature, get rid of any unused medication after 30 days or after it expires, whichever is first. Protect from light. Do not shake. Avoid exposure to extreme heat. To get rid of medications that are no longer needed or have expired: Take the medication to  a medication take-back program. Check with your pharmacy or law enforcement to find a location. If you cannot return the medication, ask your pharmacist or care team how to get rid of this medication safely. NOTE: This sheet is a summary. It may not cover all possible information. If you have questions about this medicine, talk to your doctor, pharmacist, or health care provider.  2023 Elsevier/Gold Standard (2021-02-27 00:00:00)

## 2022-01-28 NOTE — Progress Notes (Signed)
Patient seen along with medical student Zada Zhong.  I personally evaluated this patient along with the student, and verified all aspects of the history, physical exam, and medical decision making as documented by the student.  I agree with the student's documentation and have made all necessary edits.  Myesha Stillion, MD  

## 2022-01-29 MED ORDER — REPATHA SURECLICK 140 MG/ML ~~LOC~~ SOAJ: 140.0000 mg | SUBCUTANEOUS | 1 refills | Status: DC

## 2022-01-29 NOTE — Telephone Encounter (Signed)
I called the patient and informed him that the medication repatha was sent to the pharmacy and if it is to expensive to let us know and if he needs teaching on how to administer to call and let us know. He understood. Eberardo Demello,cma

## 2022-01-29 NOTE — Telephone Encounter (Signed)
I sent it to his pharmacy.  We may have to do a PA.  He should let us know if it is too expensive.  If he needs teaching on how to use the injection he can contact us after he picks it up.

## 2022-02-06 ENCOUNTER — Telehealth: Payer: Self-pay

## 2022-02-06 DIAGNOSIS — E785 Hyperlipidemia, unspecified: Secondary | ICD-10-CM

## 2022-02-06 NOTE — Telephone Encounter (Signed)
I called the patient and LVM for him to call back. I did a PA on his repatha and it got approved but it still is $312.  I called to let him know. Susanne Baumgarner,cma

## 2022-02-07 NOTE — Telephone Encounter (Signed)
Patient states he is returning our call.  I read message from Charlyne Mom, CMA, to patient.  Patient states he would like to know if he has any other options.

## 2022-02-08 NOTE — Telephone Encounter (Signed)
Given that he can't take statins there really aren't any other options. We could refer him to our pharmacist to see if she can get him approved for patient assistance for repatha.

## 2022-02-08 NOTE — Telephone Encounter (Signed)
Referral sent to clinical pharmacist to see about Repatha assistance.

## 2022-02-08 NOTE — Telephone Encounter (Signed)
Called and spoke with patient.  He is currently working on his diet and taking the advice you gave him at his last appointment.  He is willing to try Repatha if he is approved for patient assistance.

## 2022-02-08 NOTE — Addendum Note (Signed)
Addended by: Birdie Sons, Mayleen Borrero G on: 02/08/2022 02:05 PM   Modules accepted: Orders

## 2022-02-14 ENCOUNTER — Telehealth: Payer: Self-pay

## 2022-02-14 NOTE — Telephone Encounter (Signed)
Patient returned call from [employer].  I gave patient Amber's number.  Patient states he will call her.

## 2022-02-14 NOTE — Progress Notes (Signed)
   Care Guide Note  02/14/2022 Name: Juan Logan MRN: [id number] DOB: [birth year]  Referred by: Glori Luis, MD Reason for referral : Care Coordination (Outreach to schedule referral with Pharm D )   Juan Logan is a 66 y.o. year old male who is a primary care patient of Birdie Sons, Yehuda Mao, MD. Juan Logan was referred to the pharmacist for assistance related to DM.    An unsuccessful telephone outreach was attempted today to contact the patient who was referred to the pharmacy team for assistance with medication assistance. Additional attempts will be made to contact the patient.   Penne Lash, RMA Care Guide [facility]  [place], [place] [address] Direct Dial: [phone] [email or web address]

## 2022-02-15 NOTE — Progress Notes (Signed)
   Care Guide Note  02/15/2022 Name: Juan Logan MRN: [id number] DOB: 1955-12-30  Referred by: Glori Luis, MD Reason for referral : Care Coordination (Outreach to schedule referral with Pharm D )   Juan Logan is a 66 y.o. year old male who is a primary care patient of Birdie Sons, Yehuda Mao, MD. Juan Logan was referred to the pharmacist for assistance related to DM.    Successful contact was made with the patient to discuss pharmacy services including being ready for the pharmacist to call at least 5 minutes before the scheduled appointment time, to have medication bottles and any blood sugar or blood pressure readings ready for review. The patient agreed to meet with the pharmacist via with the pharmacist via telephone visit on (date/time).  03/14/2022  Penne Lash, RMA Care Guide [facility]  [place], [place] [address] Direct Dial: [phone] [email or web address]

## 2022-02-15 NOTE — Telephone Encounter (Signed)
Pt called stating [employer] approved the repatha and he would like it sent to the pharmacy

## 2022-02-18 NOTE — Telephone Encounter (Signed)
I called the pharmacy [facility], and informed the pharmacist that the repatha was approved, they stated the patient had to pay $312 and $265 was going toward the deductible.  I informed the patient and he stated he had a letter stating that Juan Logan would pay the total cost and I informed him to take that letter to [facility] pharmacy and make them aware and he understood.  Juan Logan,cma

## 2022-02-27 ENCOUNTER — Encounter: Payer: Self-pay | Admitting: Family Medicine

## 2022-03-14 ENCOUNTER — Other Ambulatory Visit: Payer: Medicare PPO | Admitting: Pharmacist

## 2022-03-15 ENCOUNTER — Telehealth: Payer: Self-pay

## 2022-03-15 NOTE — Progress Notes (Signed)
   Care Guide Note  03/15/2022 Name: Juan Logan MRN: [id number] DOB: November 30, 1955  Referred by: Leone Haven, MD Reason for referral : Care Coordination (Outreach to reschedule initial with Pharm D due to being out of office )   Juan Logan is a 67 y.o. year old male who is a primary care patient of Caryl Bis, Angela Adam, MD. Juan Logan was referred to the pharmacist for assistance related to DM.    An unsuccessful telephone outreach was attempted today to contact the patient who was referred to the pharmacy team for assistance with medication management. Additional attempts will be made to contact the patient.   Juan Logan, [name], [name] [address] Direct Dial: [phone] [email or web address]

## 2022-03-18 ENCOUNTER — Other Ambulatory Visit: Payer: Medicare PPO | Admitting: Pharmacist

## 2022-03-18 DIAGNOSIS — E7800 Pure hypercholesterolemia, unspecified: Secondary | ICD-10-CM

## 2022-03-18 DIAGNOSIS — Z8673 Personal history of transient ischemic attack (TIA), and cerebral infarction without residual deficits: Secondary | ICD-10-CM

## 2022-03-18 MED ORDER — REPATHA SURECLICK 140 MG/ML ~~LOC~~ SOAJ: 140.0000 mg | SUBCUTANEOUS | 3 refills | Status: DC

## 2022-03-18 NOTE — Progress Notes (Signed)
   03/18/2022 Name: Juan Logan MRN: [id number] DOB: 03-03-1956  Chief Complaint  Patient presents with   Medication Management   Hyperlipidemia    Reg Juan Logan is a 67 y.o. year old male who presented for a telephone visit.   They were referred to the pharmacist by their PCP for assistance in managing hyperlipidemia and medication access.    Subjective:  Care Team: Primary Care Provider: Leone Haven, MD ; Next Scheduled Visit: 07/2022  Medication Access/Adherence  Current Pharmacy:  [facility], [place] [address] [place] [address] Phone: [phone] Fax: [phone]   Patient reports affordability concerns with their medications: Yes  Patient reports access/transportation concerns to their pharmacy: No  Patient reports adherence concerns with their medications:  No     Hyperlipidemia/ASCVD Risk Reduction  Current lipid lowering medications: none, prescribed Repatha but was too expensive Medications tried in the past: atorvastatin - reports suicidal ideations  Antiplatelet regimen: aspirin 81 mg daily  ASCVD History: CVA  Current nutritional patterns: reports that he has cut back on fatty/fried foods; now trying to limit to hot dogs/hamburgers only once a month  Current medication access support: per notes, Repatha was covered by insurance but >$200   Health Maintenance  Health Maintenance Due  Topic Date Due   DTaP/Tdap/Td (1 - Tdap) Never done   COLONOSCOPY (Pts 45-24yrs Insurance coverage will need to be confirmed)  Never done     Objective: Lab Results  Component Value Date   HGBA1C 5.1 07/23/2021    Lab Results  Component Value Date   CREATININE 1.17 07/23/2021   BUN 14 07/23/2021   NA 140 07/23/2021   K 4.4 07/23/2021   CL 105 07/23/2021   CO2 27 07/23/2021    Lab Results  Component Value Date   CHOL 180 07/23/2021   HDL 30.30 (L) 07/23/2021   LDLCALC 120 (H) 07/23/2021    LDLDIRECT 82.0 07/28/2020   TRIG 149.0 07/23/2021   CHOLHDL 6 07/23/2021    Medications Reviewed Today     Reviewed by Osker Mason, RPH-CPP (Pharmacist) on 03/18/22 at 1027  Med List Status: <None>   Medication Order Taking? Sig Documenting Provider Last Dose Status Informant  aspirin EC 81 MG EC tablet [id number] No Take 1 tablet (81 mg total) by mouth daily. Swallow whole. Rinehuls, Early Chars, PA-C Taking Active   Evolocumab (REPATHA SURECLICK) 791 MG/ML SOAJ [id number]  Inject 140 mg into the skin every 14 (fourteen) days. Juan Haven, MD  Active               Assessment/Plan:   Hyperlipidemia/ASCVD Risk Reduction: - Currently uncontrolled.  - Reviewed [place] term complications of uncontrolled cholesterol, including risk of CVA/MI.  - Reviewed dietary recommendations including expected impact of dietary changes - Recommend to start Repatha 140 mg every 2 weeks. Patient amenable. Assisted in signing up for [employer] for Hyperlipidemia Copay assistance. Approved. Provided information to Pharmacy. Copay is now $0. Patient notified. He will pick up the medication and ask [facility] to show him how to inject; if he still has questions, he will call me and we'll schedule in office teaching - Scheduled for lipid panel lab appointment in 12 weeks.  Follow Up Plan: phone call in 4 weeks  Catie TJodi Mourning, PharmD, [facility] [phone]

## 2022-03-22 NOTE — Progress Notes (Signed)
   Care Guide Note  03/22/2022 Name: HABIB KISE MRN: [id number] DOB: Mar 20, 1955  Referred by: Leone Haven, MD Reason for referral : Care Coordination (Outreach to reschedule initial with Pharm D due to being out of office )   NANDAN WILLEMS is a 67 y.o. year old male who is a primary care patient of Caryl Bis, Angela Adam, MD. Tracey Harries Myer was referred to the pharmacist for assistance related to DM.    Successful contact was made with the patient to discuss pharmacy services including being ready for the pharmacist to call at least 5 minutes before the scheduled appointment time, to have medication bottles and any blood sugar or blood pressure readings ready for review. The patient agreed to meet with the pharmacist via with the pharmacist via telephone visit on (date/time).  04/15/2022  Noreene Larsson, [place], [place] [address] Direct Dial: [phone] [email or web address]

## 2022-04-15 ENCOUNTER — Other Ambulatory Visit: Payer: Medicare PPO | Admitting: Pharmacist

## 2022-04-15 MED ORDER — VARENICLINE TARTRATE 1 MG PO TABS: ORAL_TABLET | ORAL | 4 refills | Status: DC

## 2022-04-15 NOTE — Progress Notes (Signed)
04/15/2022 Name: Juan Logan MRN: [id number] DOB: [birth year]  Chief Complaint  Patient presents with   Medication Management   Hyperlipidemia    Juan Logan is a 67 y.o. year old male who presented for a telephone visit.   They were referred to the pharmacist by their PCP for assistance in managing hyperlipidemia.   Subjective:  Care Team: Primary Care Provider: Leone Haven, MD ; Next Scheduled Visit: 07/30/22  Medication Access/Adherence  Current Pharmacy:  [facility], [place] [address] [place] [address] Phone: [phone] Fax: [phone]   Patient reports affordability concerns with their medications: No  Patient reports access/transportation concerns to their pharmacy: No  Patient reports adherence concerns with their medications:  No     Hyperlipidemia/ASCVD Risk Reduction  Current lipid lowering medications: Repatha 140 mg every 14 days x 2 doses Medications tried in the past: atorvastatin - reports suicidal ideations   Denies any concerns or side effects since starting Repatha. Copay $0 due to [employer].   Antiplatelet regimen: aspirin 81 mg daily  Tobacco Abuse:  Tobacco Use History: Age when started using tobacco on a daily basis: 67 years of age Number of cigarettes per day: tries to limit to ~ 1/2 pack daily Triggers include boredom/loneliness. Likes being around people but lives alone right now;   Quit Attempt History: Longest time ever been tobacco free: 20 years (cites religious support for that). Started smoking again after his wife passed away; Has had several people in his life who smoked.   Methods tried in the past include Nicotine Patch- no benefit, was still smoking Motivators to quitting include health, religious conviction; barriers include  being around people that smoke      Objective:  Lab Results  Component Value Date   HGBA1C 5.1 07/23/2021     Lab Results  Component Value Date   CREATININE 1.17 07/23/2021   BUN 14 07/23/2021   NA 140 07/23/2021   K 4.4 07/23/2021   CL 105 07/23/2021   CO2 27 07/23/2021    Lab Results  Component Value Date   CHOL 180 07/23/2021   HDL 30.30 (L) 07/23/2021   LDLCALC 120 (H) 07/23/2021   LDLDIRECT 82.0 07/28/2020   TRIG 149.0 07/23/2021   CHOLHDL 6 07/23/2021    Medications Reviewed Today     Reviewed by Osker Mason, RPH-CPP (Pharmacist) on 03/18/22 at 1027  Med List Status: <None>   Medication Order Taking? Sig Documenting Provider Last Dose Status Informant  aspirin EC 81 MG EC tablet [id number] No Take 1 tablet (81 mg total) by mouth daily. Swallow whole. Rinehuls, Early Chars, PA-C Taking Active   Evolocumab (REPATHA SURECLICK) 735 MG/ML SOAJ [id number]  Inject 140 mg into the skin every 14 (fourteen) days. Leone Haven, MD  Active               Assessment/Plan:   Hyperlipidemia/ASCVD Risk Reduction: - Currently uncontrolled but anticipate improvement - Recommend to continue Repatha every 14 days. Discussed how to refill medication at the pharmacy. Scheduled for lipid panel in April.   Tobacco Abuse - Currently uncontrolled - Provided motivational interviewing to assess tobacco use and strategies for reduction - Provided information on 1 800 QUIT NOW support program - Educated on use of varenicline vs dual nicotine replacement therapy. Decided to start varenicline. Start varenicline 0.5 mg daily for 3 days, then 0.5 mg twice daily for 4 days, then 1 mg twice  daily thereafter. Counseled to take with food to reduce risk of GI adverse effects. Also counseled to stop therapy and contact provider if change in mood.   Follow Up Plan: phone call in 4 weeks  Catie TJodi Mourning, PharmD, [place], [facility] [phone]

## 2022-05-15 ENCOUNTER — Other Ambulatory Visit: Payer: Medicare PPO | Admitting: Pharmacist

## 2022-05-15 ENCOUNTER — Encounter: Payer: Self-pay | Admitting: Pharmacist

## 2022-05-15 MED ORDER — VARENICLINE TARTRATE 1 MG PO TABS: 1.0000 mg | ORAL_TABLET | Freq: Two times a day (BID) | ORAL | 2 refills | Status: DC

## 2022-05-15 NOTE — Progress Notes (Signed)
   05/15/2022 Name: Juan Logan MRN: [id number] DOB: Apr 06, 1955  Chief Complaint  Patient presents with   Medication Management   Hyperlipidemia    Juan Logan is a 67 y.o. year old male who presented for a telephone visit.   They were referred to the pharmacist by their PCP for assistance in managing hyperlipidemia.    Subjective:  Care Team: Primary Care Provider: Leone Haven, MD ; Next Scheduled Visit: 07/30/22  Medication Access/Adherence  Current Pharmacy:  [facility], [place] [address] [place] [address] Phone: [phone] Fax: [phone]   Patient reports affordability concerns with their medications: No  Patient reports access/transportation concerns to their pharmacy: No  Patient reports adherence concerns with their medications:  No     Hyperlipidemia/ASCVD Risk Reduction   Current lipid lowering medications: Repatha 140 mg every 14 days  Medications tried in the past: atorvastatin - reports suicidal ideations    Denies any concerns or side effects since starting Repatha. Copay $0 due to [employer].    Antiplatelet regimen: aspirin 81 mg daily   Tobacco Abuse:   Current medications: varenicline 1 mg twice daily   Denies stomach upset. Reports that he has been tobacco free for about 2 weeks now. Reports he took a friend to the doctor yesterday and was able to sit in the waiting room to support her. Feels like he has more opportunities open now that he is not smoking.    Objective:  Lab Results  Component Value Date   HGBA1C 5.1 07/23/2021    Lab Results  Component Value Date   CREATININE 1.17 07/23/2021   BUN 14 07/23/2021   NA 140 07/23/2021   K 4.4 07/23/2021   CL 105 07/23/2021   CO2 27 07/23/2021    Lab Results  Component Value Date   CHOL 180 07/23/2021   HDL 30.30 (L) 07/23/2021   LDLCALC 120 (H) 07/23/2021   LDLDIRECT 82.0 07/28/2020   TRIG 149.0  07/23/2021   CHOLHDL 6 07/23/2021    Medications Reviewed Today     Reviewed by Osker Mason, RPH-CPP (Pharmacist) on 05/15/22 at [id number]  Med List Status: <None>   Medication Order Taking? Sig Documenting Provider Last Dose Status Informant  aspirin EC 81 MG EC tablet [id number] Yes Take 1 tablet (81 mg total) by mouth daily. Swallow whole. Rinehuls, Early Chars, PA-C Taking Active   Evolocumab (REPATHA SURECLICK) [id number] MG/ML SOAJ [id number] Yes Inject 140 mg into the skin every 14 (fourteen) days. Leone Haven, MD Taking Active   varenicline (CHANTIX) 1 MG tablet [id number] Yes Take 0.5 mg daily for 3 days, then 0.5 mg twice daily for three days, then 1 mg twice daily thereafter. TAKE WITH FOOD. Leone Haven, MD Taking Active               Assessment/Plan:   Hyperlipidemia/ASCVD Risk Reduction: - Currently uncontrolled, but likely improved.  - Recommend to continue Repatha 140 mg every 2 weeks.   Tobacco Abuse - Currently improving - Provided motivational interviewing to assess tobacco use and strategies for reduction - Praised patient for quitting smoking. Encouraged to continue varenicline twice daily to help support cessation. Patient verbalizes understanding.   Follow Up Plan: phone call in 4 weeks  Catie TJodi Mourning, PharmD, [place], [facility] [phone]

## 2022-06-10 ENCOUNTER — Other Ambulatory Visit (INDEPENDENT_AMBULATORY_CARE_PROVIDER_SITE_OTHER): Payer: Medicare PPO

## 2022-06-10 DIAGNOSIS — Z8673 Personal history of transient ischemic attack (TIA), and cerebral infarction without residual deficits: Secondary | ICD-10-CM

## 2022-06-10 DIAGNOSIS — E7800 Pure hypercholesterolemia, unspecified: Secondary | ICD-10-CM

## 2022-06-11 LAB — LIPID PANEL
Chol/HDL Ratio: 2.8 ratio (ref 0.0–5.0)
Cholesterol, Total: 106 mg/dL (ref 100–199)
HDL: 38 mg/dL — ABNORMAL LOW (ref >39)
LDL Chol Calc (NIH): 51 mg/dL (ref 0–99)
Triglycerides: 87 mg/dL (ref 0–149)
VLDL Cholesterol Cal: 17 mg/dL (ref 5–40)

## 2022-06-13 ENCOUNTER — Other Ambulatory Visit: Payer: Medicare PPO | Admitting: Pharmacist

## 2022-06-27 ENCOUNTER — Other Ambulatory Visit: Payer: Medicare PPO | Admitting: Pharmacist

## 2022-06-27 NOTE — Progress Notes (Signed)
06/27/2022 Name: Juan Logan MRN: [id number] DOB: 20-Oct-1955  Chief Complaint  Patient presents with   Medication Management   Hyperlipidemia    Juan Logan is a 67 y.o. year old male who presented for a telephone visit.   They were referred to the pharmacist by their PCP for assistance in managing hyperlipidemia.    Subjective:  Care Team: Primary Care Provider: Glori Luis, MD ; Next Scheduled Visit: 07/30/22  Medication Access/Adherence  Current Pharmacy:  [facility] - [place], [place] - [address] [place] [address] Phone: [phone] Fax: [phone]   Patient reports affordability concerns with their medications: No  Patient reports access/transportation concerns to their pharmacy: No  Patient reports adherence concerns with their medications:  No     Hyperlipidemia/ASCVD Risk Reduction  Current lipid lowering medications: Repatha 140 mg every 14 days  Antiplatelet regimen: aspirin 81 mg daily  Tobacco Abuse:  Tobacco Use History: patient reports he quit smoking around Mid-February. Has been able to remain tobacco free. Quit varenicline after about a month, but denies any current cravings.   Heartburn:  Current medications: reports he's taking an OTC antacid his sister gave him, says it's 10 mg, so anticipate famotidine. Reports periodic heartburn when he eats fried foods  Objective:  Lab Results  Component Value Date   HGBA1C 5.1 07/23/2021    Lab Results  Component Value Date   CREATININE 1.17 07/23/2021   BUN 14 07/23/2021   NA 140 07/23/2021   K 4.4 07/23/2021   CL 105 07/23/2021   CO2 27 07/23/2021    Lab Results  Component Value Date   CHOL 106 06/10/2022   HDL 38 (L) 06/10/2022   LDLCALC 51 06/10/2022   LDLDIRECT 82.0 07/28/2020   TRIG 87 06/10/2022   CHOLHDL 2.8 06/10/2022    Medications Reviewed Today     Reviewed by Alden Hipp, RPH-CPP (Pharmacist) on 06/27/22 at 1309   Med List Status: <None>   Medication Order Taking? Sig Documenting Provider Last Dose Status Informant  aspirin EC 81 MG EC tablet [id number]  Take 1 tablet (81 mg total) by mouth daily. Swallow whole. Rinehuls, Kinnie Scales, PA-C  Active   Evolocumab (REPATHA SURECLICK) 140 MG/ML [place] [id number] Yes Inject 140 mg into the skin every 14 (fourteen) days. Glori Luis, MD Taking Active     Discontinued 06/27/22 1307               Assessment/Plan:   Hyperlipidemia/ASCVD Risk Reduction: - Currently controlled.  - Recommend to continue current regimen at this time. Praised for continuation of therapy and encouraged to continue to Repatha every 14 days.  - Will outreach patient to re-enroll with [employer] for copay assistance after 01/18/23  Tobacco Abuse - Currently controlled - Praised for maintenance of tobacco cessation.   Heartburn:  - Discussed nonpharmacologic methods to reduce heartburn including avoiding fatty foods, spicy foods, avoiding laying down after eating. Discussed that OTC antacids or famotidine are appropriate PRN when his family makes fried fish.   Patient also reports concerns with not being able to remember words or losing his train of thought. Notes this has been consistent for the past several months (not acute onset) and effects him more after days he has worked. He notes he gets frustrated when he can't find words. I encouraged him to discuss with PCP at next visit. Encouraged to ensure adequate sleep each night.  Follow Up Plan: PCP in 4 weeks  Catie Eppie [place], PharmD, BCACP, CPP [facility] [phone]

## 2022-07-18 ENCOUNTER — Telehealth: Payer: Self-pay | Admitting: Family Medicine

## 2022-07-18 NOTE — Telephone Encounter (Signed)
ERROR

## 2022-07-18 NOTE — Telephone Encounter (Signed)
Copied from CRM [id number]. Topic: Medicare AWV >> Jul 18, 2022  2:11 PM Payton Doughty wrote: Reason for CRM: Called patient to schedule Medicare Annual Wellness Visit (AWV). Left message for patient to call back and schedule Medicare Annual Wellness Visit (AWV).  Last date of AWV: 07/31/2022  Please schedule an appointment at any time with Annabell Sabal, CMA  .  If any questions, please contact me.  Thank you ,  Verlee Rossetti; Care Guide Ambulatory Clinical Support Clarinda l [facility] Direct Dial: [phone]

## 2022-07-29 ENCOUNTER — Ambulatory Visit: Payer: Medicare PPO | Admitting: Family Medicine

## 2022-07-30 ENCOUNTER — Encounter: Payer: Self-pay | Admitting: Family Medicine

## 2022-07-30 ENCOUNTER — Ambulatory Visit (INDEPENDENT_AMBULATORY_CARE_PROVIDER_SITE_OTHER): Payer: Medicare PPO | Admitting: Family Medicine

## 2022-07-30 VITALS — BP 134/82 | HR 67 | Temp 97.9°F | Ht 74.0 in | Wt 254.6 lb

## 2022-07-30 DIAGNOSIS — E669 Obesity, unspecified: Secondary | ICD-10-CM

## 2022-07-30 DIAGNOSIS — E7800 Pure hypercholesterolemia, unspecified: Secondary | ICD-10-CM | POA: Diagnosis not present

## 2022-07-30 DIAGNOSIS — Z87891 Personal history of nicotine dependence: Secondary | ICD-10-CM | POA: Diagnosis not present

## 2022-07-30 DIAGNOSIS — Z789 Other specified health status: Secondary | ICD-10-CM | POA: Diagnosis not present

## 2022-07-30 DIAGNOSIS — I10 Essential (primary) hypertension: Secondary | ICD-10-CM | POA: Diagnosis not present

## 2022-07-30 DIAGNOSIS — K439 Ventral hernia without obstruction or gangrene: Secondary | ICD-10-CM

## 2022-07-30 DIAGNOSIS — Z125 Encounter for screening for malignant neoplasm of prostate: Secondary | ICD-10-CM | POA: Diagnosis not present

## 2022-07-30 LAB — COMPREHENSIVE METABOLIC PANEL
ALT: 19 U/L (ref 0–53)
AST: 18 U/L (ref 0–37)
Albumin: 3.9 g/dL (ref 3.5–5.2)
Alkaline Phosphatase: 60 U/L (ref 39–117)
BUN: 11 mg/dL (ref 6–23)
CO2: 28 mEq/L (ref 19–32)
Calcium: 9.5 mg/dL (ref 8.4–10.5)
Chloride: 103 mEq/L (ref 96–112)
Creatinine, Ser: 1.13 mg/dL (ref 0.40–1.50)
GFR: 67 mL/min (ref >60.00)
Glucose, Bld: 99 mg/dL (ref 70–99)
Potassium: 4.9 mEq/L (ref 3.5–5.1)
Sodium: 139 mEq/L (ref 135–145)
Total Bilirubin: 0.6 mg/dL (ref 0.2–1.2)
Total Protein: 7.1 g/dL (ref 6.0–8.3)

## 2022-07-30 LAB — PSA, MEDICARE: PSA: 1.32 ng/ml (ref 0.10–4.00)

## 2022-07-30 LAB — HEMOGLOBIN A1C: Hgb A1c MFr Bld: 5.1 % (ref 4.6–6.5)

## 2022-07-30 NOTE — Assessment & Plan Note (Signed)
Chronic issue.  Patient notes no current issues related to this.  He will monitor for abdominal pain or inability to reduce the hernia and seek medical attention if those occur.

## 2022-07-30 NOTE — Assessment & Plan Note (Signed)
Chronic issue.  Unable to tolerate Lipitor due to suicidal ideation.  Patient has been on Repatha with good benefit and is tolerating this well.  He will continue Repatha 140 mg every 14 days.

## 2022-07-30 NOTE — Progress Notes (Signed)
Marikay Alar, MD Phone: [phone]  Juan Logan is a 67 y.o. male who presents today for f/u.  HYPERTENSION Disease Monitoring Home BP Monitoring not checking at home Chest pain- no    Dyspnea- no Medications Compliance-  not on medications. Lightheadedness-  no  Edema- no BMET    Component Value Date/Time   NA 140 07/23/2021 0732   NA 143 11/01/2019 1200   K 4.4 07/23/2021 0732   CL 105 07/23/2021 0732   CO2 27 07/23/2021 0732   GLUCOSE 110 (H) 07/23/2021 0732   BUN 14 07/23/2021 0732   BUN 9 11/01/2019 1200   CREATININE 1.17 07/23/2021 0732   CALCIUM 9.3 07/23/2021 0732   GFRNONAA 78 11/01/2019 1200   GFRAA 90 11/01/2019 1200   Former smoker: Patient notes he quit after our last visit.  He smoked about 3 packs/day from age 45 until his 40s and then quit for 20 years and then started smoking again a few years ago.  Hyperlipidemia: Patient is taking Repatha.  Last LDL was adequately controlled.  No claudication, right upper quadrant pain, or myalgias.  Ventral hernia: Patient notes no pain related to this.  It does reduce on its own.  He wears an abdominal binder that is beneficial for this for him.  Social History   Tobacco Use  Smoking Status Former   Types: Cigarettes  Smokeless Tobacco Never  Tobacco Comments   Patient smoked 3 packs/day at the most, started smoking at age 32,    Current Outpatient Medications on File Prior to Visit  Medication Sig Dispense Refill   aspirin EC 81 MG EC tablet Take 1 tablet (81 mg total) by mouth daily. Swallow whole. 30 tablet 11   Evolocumab (REPATHA SURECLICK) 140 MG/ML SOAJ Inject 140 mg into the skin every 14 (fourteen) days. 6 mL 3   No current facility-administered medications on file prior to visit.     ROS see history of present illness  Objective  Physical Exam Vitals:   07/30/22 1035 07/30/22 1049  BP: (!) 142/86 134/82  Pulse: 67   Temp: 97.9 F (36.6 C)   SpO2: 97%     BP Readings from Last 3  Encounters:  07/30/22 134/82  01/28/22 134/76  07/25/21 130/70   Wt Readings from Last 3 Encounters:  07/30/22 254 lb 9.6 oz (115.5 kg)  01/28/22 244 lb 6.4 oz (110.9 kg)  07/30/21 241 lb (109.3 kg)    Physical Exam Constitutional:      General: He is not in acute distress.    Appearance: He is not diaphoretic.  Cardiovascular:     Rate and Rhythm: Normal rate and regular rhythm.     Heart sounds: Normal heart sounds.  Pulmonary:     Effort: Pulmonary effort is normal.     Breath sounds: Normal breath sounds.  Skin:    General: Skin is warm and dry.  Neurological:     Mental Status: He is alert.      Assessment/Plan: Please see individual problem list.  Former smoker Assessment & Plan: Chronic issue.  Congratulated on smoking cessation.  Discussed lung cancer screening though patient declines at this time.   Pure hypercholesterolemia Assessment & Plan: Chronic issue.  Unable to tolerate Lipitor due to suicidal ideation.  Patient has been on Repatha with good benefit and is tolerating this well.  He will continue Repatha 140 mg every 14 days.  Orders: -     Comprehensive metabolic panel  Ventral hernia without obstruction  or gangrene Assessment & Plan: Chronic issue.  Patient notes no current issues related to this.  He will monitor for abdominal pain or inability to reduce the hernia and seek medical attention if those occur.   Statin intolerance Assessment & Plan: Previous statin use resulted in suicidal ideation.  We will not proceed with any further statin use.   Prostate cancer screening -     PSA, Medicare  Obesity (BMI 30-39.9) -     Hemoglobin A1c  Primary hypertension Assessment & Plan: Chronic issue.  Blood pressure improved on recheck.  We will continue to monitor off of medication.      Health Maintenance: Discussed colon cancer screening though patient defers at this time.  He notes he will think about colonoscopy and Cologuard as  options.  He denies family history of colon cancer.  He will let me know if he changes his mind on this prior to his next visit.  Return in about 6 months (around 01/30/2023).   Marikay Alar, MD [facility]

## 2022-07-30 NOTE — Assessment & Plan Note (Signed)
Chronic issue.  Congratulated on smoking cessation.  Discussed lung cancer screening though patient declines at this time.

## 2022-07-30 NOTE — Assessment & Plan Note (Signed)
Chronic issue.  Blood pressure improved on recheck.  We will continue to monitor off of medication.

## 2022-07-30 NOTE — Assessment & Plan Note (Signed)
Previous statin use resulted in suicidal ideation.  We will not proceed with any further statin use.

## 2022-11-15 ENCOUNTER — Ambulatory Visit (INDEPENDENT_AMBULATORY_CARE_PROVIDER_SITE_OTHER): Payer: Medicare PPO | Admitting: *Deleted

## 2022-11-15 VITALS — Ht 74.0 in | Wt 255.0 lb

## 2022-11-15 DIAGNOSIS — Z0000 Encounter for general adult medical examination without abnormal findings: Secondary | ICD-10-CM

## 2022-11-15 NOTE — Patient Instructions (Signed)
Juan Logan , Thank you for taking time to come for your Medicare Wellness Visit. I appreciate your ongoing commitment to your health goals. Please review the following plan we discussed and let me know if I can assist you in the future.   Referrals/Orders/Follow-Ups/Clinician Recommendations: None  This is a list of the screening recommended for you and due dates:  Health Maintenance  Topic Date Due   DTaP/Tdap/Td vaccine (1 - Tdap) Never done   Colon Cancer Screening  Never done   Flu Shot  Never done   Medicare Annual Wellness Visit  11/15/2023   HPV Vaccine  Aged Out   Pneumonia Vaccine  Discontinued   COVID-19 Vaccine  Discontinued   Hepatitis C Screening  Discontinued   Zoster (Shingles) Vaccine  Discontinued    Advanced directives: (Declined) Advance directive discussed with you today. Even though you declined this today, please call our office should you change your mind, and we can give you the proper paperwork for you to fill out.  Next Medicare Annual Wellness Visit scheduled for next year: Yes 11/19/23 @ 9:00

## 2022-11-15 NOTE — Progress Notes (Signed)
Subjective:   Juan Logan is a 67 y.o. male who presents for Medicare Annual/Subsequent preventive examination.  Visit Complete: Virtual  I connected with  Juan Logan on 11/15/22 by a audio enabled telemedicine application and verified that I am speaking with the correct person using two identifiers.  Patient Location: Home  Provider Location: Home Office  I discussed the limitations of evaluation and management by telemedicine. The patient expressed understanding and agreed to proceed.  Vital Signs: Unable to obtain new vitals due to this being a telehealth visit.  Review of Systems     Cardiac Risk Factors include: advanced age (>43men, >64 women);dyslipidemia;male gender;hypertension;obesity (BMI >30kg/m2)     Objective:    Today's Vitals   11/15/22 0900  Weight: 255 lb (115.7 kg)  Height: 6\' 2"  (1.88 m)   Body mass index is 32.74 kg/m.     11/15/2022    9:17 AM 07/30/2021    8:38 AM 07/26/2020    8:41 AM 10/15/2019    3:00 PM  Advanced Directives  Does Patient Have a Medical Advance Directive? No No No No  Would patient like information on creating a medical advance directive? No - Patient declined No - Patient declined No - Patient declined No - Patient declined    Current Medications (verified) Outpatient Encounter Medications as of 11/15/2022  Medication Sig   aspirin EC 81 MG EC tablet Take 1 tablet (81 mg total) by mouth daily. Swallow whole.   Evolocumab (REPATHA SURECLICK) 140 MG/ML SOAJ Inject 140 mg into the skin every 14 (fourteen) days.   No facility-administered encounter medications on file as of 11/15/2022.    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   Past Surgical History:  Procedure Laterality Date   IR CT HEAD LTD  10/15/2019   IR INTRAVSC STENT CERV CAROTID W/EMB-PROT  MOD SED INCL ANGIO  10/15/2019   IR PERCUTANEOUS ART THROMBECTOMY/INFUSION INTRACRANIAL INC DIAG ANGIO  10/15/2019       IR PERCUTANEOUS ART THROMBECTOMY/INFUSION INTRACRANIAL INC DIAG ANGIO  10/15/2019   RADIOLOGY WITH ANESTHESIA N/A 10/15/2019   Procedure: IR WITH ANESTHESIA;  Surgeon: Radiologist, Medication, MD;  Location: [facility] OR;  Service: Radiology;  Laterality: N/A;   Family History  Problem Relation Age of Onset   Hypertension Mother    Stroke Mother    Hypertension Father    Prostate cancer Father    Social History   Socioeconomic History   Marital status: Widowed    Spouse name: Vicky   Number of children: 1   Years of education: Not on file   Highest education level: Associate degree: academic program  Occupational History   Occupation: retired  Tobacco Use   Smoking status: Every Day    Types: Cigarettes   Smokeless tobacco: Never   Tobacco comments:    Patient smoked 3 packs/day at the most, started smoking at age 62,  Vaping Use   Vaping status: Never Used  Substance and Sexual Activity   Alcohol use: Never   Drug use: Never   Sexual activity: Not on file  Other Topics Concern   Not on file  Social History Narrative   ** Merged History Encounter **       Widowed Right Handed Drinks 9 cups of caffeine   Social Determinants of Health  Financial Resource Strain: Low Risk  (11/15/2022)   Overall Financial Resource Strain (CARDIA)    Difficulty of Paying Living Expenses: Not hard at all  Food Insecurity: No Food Insecurity (11/15/2022)   Hunger Vital Sign    Worried About Running Out of Food in the Last Year: Never true    Ran Out of Food in the Last Year: Never true  Transportation Needs: No Transportation Needs (11/15/2022)   PRAPARE - Administrator, Civil Service (Medical): No    Lack of Transportation (Non-Medical): No  Physical Activity: Insufficiently Active (11/15/2022)   Exercise Vital Sign    Days of Exercise per Week: 4 days    Minutes of  Exercise per Session: 30 min  Stress: Stress Concern Present (11/15/2022)   [employer] of Occupational Health - Occupational Stress Questionnaire    Feeling of Stress : To some extent  Social Connections: Moderately Isolated (11/15/2022)   Social Connection and Isolation Panel [NHANES]    Frequency of Communication with Friends and Family: More than three times a week    Frequency of Social Gatherings with Friends and Family: More than three times a week    Attends Religious Services: More than 4 times per year    Active Member of [employer] or Organizations: No    Attends Banker Meetings: Never    Marital Status: Separated    Tobacco Counseling Ready to quit: Yes Counseling given: Yes Tobacco comments: Patient smoked 3 packs/day at the most, started smoking at age 44,   Clinical Intake:  Pre-visit preparation completed: Yes  Pain : No/denies pain     BMI - recorded: 32.74 Nutritional Status: BMI > 30  Obese Nutritional Risks: None Diabetes: No  How often do you need to have someone help you when you read instructions, pamphlets, or other written materials from your doctor or pharmacy?: 1 - Never  Interpreter Needed?: No  Information entered by :: R. Brock Mokry LPN   Activities of Daily Living    11/15/2022    9:06 AM  In your present state of health, do you have any difficulty performing the following activities:  Hearing? 0  Vision? 0  Comment glasses  Difficulty concentrating or making decisions? 1  Comment just a little  Walking or climbing stairs? 0  Dressing or bathing? 0  Doing errands, shopping? 0  Preparing Food and eating ? N  Using the Toilet? N  In the past six months, have you accidently leaked urine? N  Do you have problems with loss of bowel control? N  Managing your Medications? N  Managing your Finances? N  Housekeeping or managing your Housekeeping? N    Patient Care Team: Glori Luis, MD as PCP - General (Family  Medicine) Alden Hipp, RPH-CPP (Pharmacist)  Indicate any recent Medical Services you may have received from other than [facility] providers in the past year (date may be approximate).     Assessment:   This is a routine wellness examination for Juan Logan.  Hearing/Vision screen Hearing Screening - Comments:: No issues Vision Screening - Comments:: Wears glasses   Goals Addressed             This Visit's Progress    Patient Stated       Wants to quit smoking       Depression Screen    11/15/2022    9:12 AM 07/30/2022   10:37 AM 01/28/2022    9:57 AM 07/30/2021    8:39 AM  07/25/2021   10:40 AM 02/23/2021   11:59 AM 07/26/2020    8:39 AM  PHQ 2/9 Scores  PHQ - 2 Score 0 0 0 0 0 1 0  PHQ- 9 Score 0 0         Fall Risk    11/15/2022    9:09 AM 07/30/2022   10:37 AM 01/28/2022    9:57 AM 07/30/2021    8:39 AM 07/25/2021   10:40 AM  Fall Risk   Falls in the past year? 0 0 0 0 0  Number falls in past yr: 0 0 0 0 0  Injury with Fall? 0 0 0  0  Risk for fall due to : No Fall Risks No Fall Risks No Fall Risks No Fall Risks No Fall Risks  Follow up Falls prevention discussed;Falls evaluation completed Falls evaluation completed Falls evaluation completed Falls evaluation completed Falls evaluation completed    MEDICARE RISK AT HOME: Medicare Risk at Home Any stairs in or around the home?: Yes If so, are there any without handrails?: Yes Home free of loose throw rugs in walkways, pet beds, electrical cords, etc?: Yes Adequate lighting in your home to reduce risk of falls?: Yes Life alert?: No Use of a cane, walker or w/c?: No Grab bars in the bathroom?: No Shower chair or bench in shower?: No Elevated toilet seat or a handicapped toilet?: No   Cognitive Function:        11/15/2022    9:18 AM 07/30/2021    9:03 AM 07/26/2020    8:43 AM  6CIT Screen  What Year? 0 points 0 points 0 points  What month? 0 points 0 points 0 points  What time? 0 points 0 points 0 points   Count back from 20 0 points 0 points 0 points  Months in reverse 0 points 0 points 0 points  Repeat phrase 6 points 0 points   Total Score 6 points 0 points     Immunizations  There is no immunization history on file for this patient.  TDAP status: Due, Education has been provided regarding the importance of this vaccine. Advised may receive this vaccine at local pharmacy or Health Dept. Aware to provide a copy of the vaccination record if obtained from local pharmacy or Health Dept. Verbalized acceptance and understanding.  Flu Vaccine status: Due, Education has been provided regarding the importance of this vaccine. Advised may receive this vaccine at local pharmacy or Health Dept. Aware to provide a copy of the vaccination record if obtained from local pharmacy or Health Dept. Verbalized acceptance and understanding.  Pneumococcal vaccine status: Due, Education has been provided regarding the importance of this vaccine. Advised may receive this vaccine at local pharmacy or Health Dept. Aware to provide a copy of the vaccination record if obtained from local pharmacy or Health Dept. Verbalized acceptance and understanding.  Covid-19 vaccine status: Declined, Education has been provided regarding the importance of this vaccine but patient still declined. Advised may receive this vaccine at local pharmacy or Health Dept.or vaccine clinic. Aware to provide a copy of the vaccination record if obtained from local pharmacy or Health Dept. Verbalized acceptance and understanding.  Qualifies for Shingles Vaccine? Yes   Zostavax completed No   Shingrix Completed?: No.    Education has been provided regarding the importance of this vaccine. Patient has been advised to call insurance company to determine out of pocket expense if they have not yet received this vaccine. Advised may also receive  vaccine at local pharmacy or Health Dept. Verbalized acceptance and understanding.  Screening Tests Health  Maintenance  Topic Date Due   DTaP/Tdap/Td (1 - Tdap) Never done   Colonoscopy  Never done   Medicare Annual Wellness (AWV)  07/31/2022   INFLUENZA VACCINE  Never done   HPV VACCINES  Aged Out   Pneumonia Vaccine 1+ Years old  Discontinued   COVID-19 Vaccine  Discontinued   Hepatitis C Screening  Discontinued   Zoster Vaccines- Shingrix  Discontinued    Health Maintenance  Health Maintenance Due  Topic Date Due   DTaP/Tdap/Td (1 - Tdap) Never done   Colonoscopy  Never done   Medicare Annual Wellness (AWV)  07/31/2022   INFLUENZA VACCINE  Never done    Colorectal cancer screening Declines at this time will discuss with PCP  Lung Cancer Screening: (Low Dose CT Chest recommended if Age 76-80 years, 20 pack-year currently smoking OR have quit w/in 15years.) does qualify.  Patient declines at this times. Will discuss with PCP at next visit    Additional Screening:  Hepatitis C Screening: does qualify; Completed Patient declines  Vision Screening: Recommended annual ophthalmology exams for early detection of glaucoma and other disorders of the eye. Is the patient up to date with their annual eye exam?  No  Who is the provider or what is the name of the office in which the patient attends annual eye exams? Patient declines has not had an eye exam in many years. Patient will consider If pt is not established with a provider, would they like to be referred to a provider to establish care? No .   Dental Screening: Recommended annual dental exams for proper oral hygiene    Community Resource Referral / Chronic Care Management: CRR required this visit?  No   CCM required this visit?  No     Plan:     I have personally reviewed and noted the following in the patient's chart:   Medical and social history Use of alcohol, tobacco or illicit drugs  Current medications and supplements including opioid prescriptions. Patient is not currently taking opioid  prescriptions. Functional ability and status Nutritional status Physical activity Advanced directives List of other physicians Hospitalizations, surgeries, and ER visits in previous 12 months Vitals Screenings to include cognitive, depression, and falls Referrals and appointments  In addition, I have reviewed and discussed with patient certain preventive protocols, quality metrics, and best practice recommendations. A written personalized care plan for preventive services as well as general preventive health recommendations were provided to patient.     Sydell Axon, LPN   03/16/1094   After Visit Summary: (MyChart) Due to this being a telephonic visit, the after visit summary with patients personalized plan was offered to patient via MyChart   Nurse Notes: None

## 2023-01-31 ENCOUNTER — Encounter: Payer: Self-pay | Admitting: Family Medicine

## 2023-01-31 ENCOUNTER — Ambulatory Visit (INDEPENDENT_AMBULATORY_CARE_PROVIDER_SITE_OTHER): Payer: Medicare PPO | Admitting: Family Medicine

## 2023-01-31 VITALS — BP 122/72 | HR 75 | Temp 97.7°F | Ht 74.0 in | Wt 254.2 lb

## 2023-01-31 DIAGNOSIS — E7800 Pure hypercholesterolemia, unspecified: Secondary | ICD-10-CM

## 2023-01-31 DIAGNOSIS — I10 Essential (primary) hypertension: Secondary | ICD-10-CM | POA: Diagnosis not present

## 2023-01-31 DIAGNOSIS — K439 Ventral hernia without obstruction or gangrene: Secondary | ICD-10-CM

## 2023-01-31 NOTE — Assessment & Plan Note (Signed)
Chronic issue.  Continue Repatha 140 mg every 14 days.  Unable to tolerate statins given suicidal ideation while on statins previously.

## 2023-01-31 NOTE — Assessment & Plan Note (Signed)
Chronic issue.  Refer to general surgery to consider surgical management.

## 2023-01-31 NOTE — Progress Notes (Signed)
  Marikay Alar, MD Phone: [phone]  Juan Logan is a 67 y.o. male who presents today for follow-up.  Hypertension: Not checking blood pressures.  No chest pain or shortness of breath.  Hyperlipidemia: Taking Repatha.  No claudication, right upper quadrant pain, or myalgias.  Ventral hernia: Patient notes occasional discomfort with this.  He does use an abdominal binder to help with this.  He is ready to see a surgeon to consider treatment.  Social History   Tobacco Use  Smoking Status Every Day   Types: Cigarettes  Smokeless Tobacco Never  Tobacco Comments   Patient smoked 3 packs/day at the most, started smoking at age 24,    Current Outpatient Medications on File Prior to Visit  Medication Sig Dispense Refill   aspirin EC 81 MG EC tablet Take 1 tablet (81 mg total) by mouth daily. Swallow whole. 30 tablet 11   Evolocumab (REPATHA SURECLICK) 140 MG/ML SOAJ Inject 140 mg into the skin every 14 (fourteen) days. 6 mL 3   No current facility-administered medications on file prior to visit.     ROS see history of present illness  Objective  Physical Exam Vitals:   01/31/23 0934 01/31/23 0935  BP: 128/82 122/72  Pulse: 75   Temp: 97.7 F (36.5 C)   SpO2: 96%     BP Readings from Last 3 Encounters:  01/31/23 122/72  07/30/22 134/82  01/28/22 134/76   Wt Readings from Last 3 Encounters:  01/31/23 254 lb 3.2 oz (115.3 kg)  11/15/22 255 lb (115.7 kg)  07/30/22 254 lb 9.6 oz (115.5 kg)    Physical Exam Constitutional:      General: He is not in acute distress.    Appearance: He is not diaphoretic.  Cardiovascular:     Rate and Rhythm: Normal rate and regular rhythm.     Heart sounds: Normal heart sounds.  Pulmonary:     Effort: Pulmonary effort is normal.     Breath sounds: Normal breath sounds.  Abdominal:     General: Bowel sounds are normal.     Palpations: Abdomen is soft.     Hernia: A hernia (Ventral hernia noted, reducible, nontender) is  present.  Skin:    General: Skin is warm and dry.  Neurological:     Mental Status: He is alert.      Assessment/Plan: Please see individual problem list.  Pure hypercholesterolemia Assessment & Plan: Chronic issue.  Continue Repatha 140 mg every 14 days.  Unable to tolerate statins given suicidal ideation while on statins previously.   Ventral hernia without obstruction or gangrene Assessment & Plan: Chronic issue.  Refer to general surgery to consider surgical management.   Primary hypertension Assessment & Plan: Chronic issue.  Adequately controlled controlled on recheck.  We will continue to monitor off of medication.      Health Maintenance: Patient has Cologuard at home.  I encouraged him to complete this.  Return in about 6 months (around 07/31/2023) for Transfer of care with [facility].   Marikay Alar, MD [facility]

## 2023-01-31 NOTE — Assessment & Plan Note (Signed)
Chronic issue.  Adequately controlled controlled on recheck.  We will continue to monitor off of medication.

## 2023-02-11 ENCOUNTER — Other Ambulatory Visit: Payer: Self-pay | Admitting: Family Medicine

## 2023-02-11 DIAGNOSIS — Z8673 Personal history of transient ischemic attack (TIA), and cerebral infarction without residual deficits: Secondary | ICD-10-CM

## 2023-02-11 DIAGNOSIS — E7800 Pure hypercholesterolemia, unspecified: Secondary | ICD-10-CM

## 2023-02-25 ENCOUNTER — Encounter: Payer: Self-pay | Admitting: Pharmacist

## 2023-02-25 NOTE — Progress Notes (Signed)
[employer] - Re-enrollment 2025   Medication(s): All cholesterol medications (Prescribed Repatha)   Currently Enrolled: Enrollment for 2024 has expired; Eligible for re-enrollment    Application Status:  Approved for re-enrollment    [employer]: ID [id number] Fund: Hypercholesterolemia - Medicare Access Assistance Type: Co-pay Start Date: 02/17/2023 End Date: 02/16/2024               Rx Card: Card No.  [id number] RX BIN:  [id number] PCN:  [id number] Group:  [id number]   12/17: Called patient's pharmacy [facility]) and provided updated card information.    Loree Fee, PharmD Clinical Pharmacist [facility] [phone]

## 2023-03-06 LAB — IFOBT (OCCULT BLOOD): IFOBT: NEGATIVE

## 2023-07-09 ENCOUNTER — Ambulatory Visit (INDEPENDENT_AMBULATORY_CARE_PROVIDER_SITE_OTHER): Admitting: Nurse Practitioner

## 2023-07-09 ENCOUNTER — Encounter: Payer: Medicare PPO | Admitting: Nurse Practitioner

## 2023-07-09 ENCOUNTER — Encounter: Payer: Self-pay | Admitting: Nurse Practitioner

## 2023-07-09 VITALS — BP 140/76 | HR 73 | Temp 97.7°F | Ht 74.0 in | Wt 250.0 lb

## 2023-07-09 DIAGNOSIS — E7800 Pure hypercholesterolemia, unspecified: Secondary | ICD-10-CM

## 2023-07-09 DIAGNOSIS — I10 Essential (primary) hypertension: Secondary | ICD-10-CM | POA: Diagnosis not present

## 2023-07-09 DIAGNOSIS — Z789 Other specified health status: Secondary | ICD-10-CM | POA: Diagnosis not present

## 2023-07-09 DIAGNOSIS — F17219 Nicotine dependence, cigarettes, with unspecified nicotine-induced disorders: Secondary | ICD-10-CM | POA: Diagnosis not present

## 2023-07-09 LAB — COMPREHENSIVE METABOLIC PANEL WITH GFR
ALT: 12 U/L (ref 0–53)
AST: 13 U/L (ref 0–37)
Albumin: 3.9 g/dL (ref 3.5–5.2)
Alkaline Phosphatase: 81 U/L (ref 39–117)
BUN: 8 mg/dL (ref 6–23)
CO2: 29 meq/L (ref 19–32)
Calcium: 9.2 mg/dL (ref 8.4–10.5)
Chloride: 102 meq/L (ref 96–112)
Creatinine, Ser: 1.12 mg/dL (ref 0.40–1.50)
GFR: 67.27 mL/min (ref >60.00)
Glucose, Bld: 103 mg/dL — ABNORMAL HIGH (ref 70–99)
Potassium: 4.7 meq/L (ref 3.5–5.1)
Sodium: 137 meq/L (ref 135–145)
Total Bilirubin: 0.6 mg/dL (ref 0.2–1.2)
Total Protein: 7.3 g/dL (ref 6.0–8.3)

## 2023-07-09 LAB — LIPID PANEL
Cholesterol: 94 mg/dL (ref 0–200)
HDL: 30.2 mg/dL — ABNORMAL LOW (ref >39.00)
LDL Cholesterol: 41 mg/dL (ref 0–99)
NonHDL: 63.69
Total CHOL/HDL Ratio: 3
Triglycerides: 111 mg/dL (ref 0.0–149.0)
VLDL: 22.2 mg/dL (ref 0.0–40.0)

## 2023-07-09 MED ORDER — VARENICLINE TARTRATE (STARTER) 0.5 MG X 11 & 1 MG X 42 PO TBPK: ORAL_TABLET | ORAL | 0 refills | Status: AC

## 2023-07-09 NOTE — Assessment & Plan Note (Signed)
 He resumed smoking 10 cigarettes per day but has a strong desire to quit. Previous cessation was successful with Chantix . Tolerated medication well. New starter titration sent to pharmacy. Encouraged smoking cessation. We will follow up in 3-4 weeks to assess progress and if he wishes to continue with medication.

## 2023-07-09 NOTE — Assessment & Plan Note (Signed)
 His condition is well-managed with Repatha , cholesterol levels are well controlled. Continue Repatha . Check lipid panel and CMP today.

## 2023-07-09 NOTE — Assessment & Plan Note (Signed)
 Chronic. Blood pressure is elevated without symptoms. Remained elevated but with improvement on recheck. Previously well controlled. We will continue to monitor and restart medication if necessary.

## 2023-07-09 NOTE — Assessment & Plan Note (Addendum)
 Previous use resulted in suicidal ideation. We will refrain from statin use.

## 2023-07-09 NOTE — Progress Notes (Signed)
 Bluford Burkitt, NP-C Phone: [phone]  Juan Logan is a 68 y.o. male who presents today for transfer of care.   Discussed the use of AI scribe software for clinical note transcription with the patient, who gave verbal consent to proceed.  History of Present Illness   Juan Logan is a 68 year old male who presents for a transfer of care visit.  He experiences elevated blood pressure during medical visits, which typically improves upon rechecking. He is not on any antihypertensive medication and does not monitor his blood pressure at home. No chest pain, shortness of breath, or dizziness.  He smokes ten cigarettes a day and has a history of smoking cessation attempts. He previously quit smoking for a month using medication but did not continue. He dislikes smoking due to the odor it causes and is interested in resuming the medication that helped him quit, though he cannot recall its name.  He administers Repatha  injections biweekly for cholesterol management, with the last injection last week and the next one scheduled for next week. His cholesterol levels are well-controlled with this treatment.  He experienced a stroke three and a half years ago, which has affected his memory. He recalls a negative experience with a previous doctor following the stroke, influencing his perception of medical care.      Social History   Tobacco Use  Smoking Status Every Day   Types: Cigarettes  Smokeless Tobacco Never  Tobacco Comments   Patient smoked 3 packs/day at the most, started smoking at age 11,    Current Outpatient Medications on File Prior to Visit  Medication Sig Dispense Refill   aspirin  EC 81 MG EC tablet Take 1 tablet (81 mg total) by mouth daily. Swallow whole. 30 tablet 11   REPATHA  SURECLICK 140 MG/ML SOAJ INJECT 140 MG INTO THE SKIN EVERY 14 DAYS 6 mL 3   No current facility-administered medications on file prior to visit.     ROS see history of present  illness  Objective  Physical Exam Vitals:   07/09/23 1007 07/09/23 1020  BP: (!) 150/78 (!) 140/76  Pulse: 73   Temp: 97.7 F (36.5 C)   SpO2: 98%     BP Readings from Last 3 Encounters:  07/09/23 (!) 140/76  01/31/23 122/72  07/30/22 134/82   Wt Readings from Last 3 Encounters:  07/09/23 250 lb (113.4 kg)  01/31/23 254 lb 3.2 oz (115.3 kg)  11/15/22 255 lb (115.7 kg)    Physical Exam Constitutional:      General: He is not in acute distress.    Appearance: Normal appearance.  HENT:     Head: Normocephalic.  Cardiovascular:     Rate and Rhythm: Normal rate and regular rhythm.     Heart sounds: Normal heart sounds.  Pulmonary:     Effort: Pulmonary effort is normal.     Breath sounds: Normal breath sounds.  Skin:    General: Skin is warm and dry.  Neurological:     General: No focal deficit present.     Mental Status: He is alert.  Psychiatric:        Mood and Affect: Mood normal.        Behavior: Behavior normal.     Assessment/Plan: Please see individual problem list.  Pure hypercholesterolemia Assessment & Plan: His condition is well-managed with Repatha , cholesterol levels are well controlled. Continue Repatha . Check lipid panel and CMP today.   Orders: -     Comprehensive metabolic panel  with GFR -     Lipid panel  Statin intolerance Assessment & Plan: Previous use resulted in suicidal ideation. We will refrain from statin use.    Primary hypertension Assessment & Plan: Chronic. Blood pressure is elevated without symptoms. Remained elevated but with improvement on recheck. Previously well controlled. We will continue to monitor and restart medication if necessary.    Nicotine dependence, cigarettes, w unsp disorders Assessment & Plan: He resumed smoking 10 cigarettes per day but has a strong desire to quit. Previous cessation was successful with Chantix . Tolerated medication well. New starter titration sent to pharmacy. Encouraged smoking  cessation. We will follow up in 3-4 weeks to assess progress and if he wishes to continue with medication.   Orders: -     Varenicline  Tartrate (Starter); Take 0.5 mg by mouth daily for 3 days, THEN 0.5 mg 2 (two) times daily for 4 days, THEN 1 mg 2 (two) times daily for 21 days.  Dispense: 53 each; Refill: 0     Return in about 6 months (around 01/08/2024) for Follow up.   Bluford Burkitt, NP-C [facility]

## 2023-11-19 ENCOUNTER — Ambulatory Visit (INDEPENDENT_AMBULATORY_CARE_PROVIDER_SITE_OTHER): Payer: Medicare PPO | Admitting: *Deleted

## 2023-11-19 VITALS — Ht 74.0 in | Wt 250.0 lb

## 2023-11-19 DIAGNOSIS — Z0000 Encounter for general adult medical examination without abnormal findings: Secondary | ICD-10-CM

## 2023-11-19 NOTE — Progress Notes (Signed)
 Subjective:   Juan Logan is a 68 y.o. who presents for a Medicare Wellness preventive visit.  As a reminder, Annual Wellness Visits don't include a physical exam, and some assessments may be limited, especially if this visit is performed virtually. We may recommend an in-person follow-up visit with your provider if needed.  Visit Complete: Virtual I connected with  [name] on 11/19/23 by a audio enabled telemedicine application and verified that I am speaking with the correct person using two identifiers.  Patient Location: Home  Provider Location: Home Office  I discussed the limitations of evaluation and management by telemedicine. The patient expressed understanding and agreed to proceed.  Vital Signs: Because this visit was a virtual/telehealth visit, some criteria may be missing or patient reported. Any vitals not documented were not able to be obtained and vitals that have been documented are patient reported.  VideoDeclined- This patient declined Librarian, academic. Therefore the visit was completed with audio only.  Persons Participating in Visit: Patient.  AWV Questionnaire: No: Patient Medicare AWV questionnaire was not completed prior to this visit.  Cardiac Risk Factors include: advanced age (>11men, >1 women);obesity (BMI >30kg/m2);male gender;dyslipidemia;hypertension;smoking/ tobacco exposure     Objective:    Today's Vitals   11/19/23 0852  Weight: 250 lb (113.4 kg)  Height: 6' 2 (1.88 m)   Body mass index is 32.1 kg/m.     11/19/2023    9:13 AM 11/15/2022    9:17 AM 07/30/2021    8:38 AM 07/26/2020    8:41 AM 10/15/2019    3:00 PM  Advanced Directives  Does Patient Have a Medical Advance Directive? No No No No No  Would patient like information on creating a medical advance directive? No - Patient declined No - Patient declined No - Patient declined No - Patient declined No - Patient declined    Current Medications  (verified) Outpatient Encounter Medications as of 11/19/2023  Medication Sig   aspirin  EC 81 MG EC tablet Take 1 tablet (81 mg total) by mouth daily. Swallow whole.   REPATHA  SURECLICK 140 MG/ML SOAJ INJECT 140 MG INTO THE SKIN EVERY 14 DAYS   No facility-administered encounter medications on file as of 11/19/2023.    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   Past Surgical History:  Procedure Laterality Date   IR CT HEAD LTD  10/15/2019   IR INTRAVSC STENT CERV CAROTID W/EMB-PROT MOD SED INCL ANGIO  10/15/2019   IR PERCUTANEOUS ART THROMBECTOMY/INFUSION INTRACRANIAL INC DIAG ANGIO  10/15/2019       IR PERCUTANEOUS ART THROMBECTOMY/INFUSION INTRACRANIAL INC DIAG ANGIO  10/15/2019   RADIOLOGY WITH ANESTHESIA N/A 10/15/2019   Procedure: IR WITH ANESTHESIA;  Surgeon: Radiologist, Medication, MD;  Location: [facility] OR;  Service: Radiology;  Laterality: N/A;   Family History  Problem Relation Age of Onset   Hypertension Mother    Stroke Mother    Hypertension Father    Prostate cancer Father    Social History   Socioeconomic History   Marital status: Widowed    Spouse name: Vicky   Number of children: 1   Years of education: Not on file   Highest education level: Associate degree: academic program  Occupational History   Occupation: retired  Tobacco Use   Smoking status: Every  Day    Current packs/day: 0.50    Average packs/day: 0.5 packs/day for 11.7 years (5.8 ttl pk-yrs)    Types: Cigarettes    Start date: 2014   Smokeless tobacco: Never   Tobacco comments:    Patient smoked 3 packs/day at the most, started smoking at age 66,  Vaping Use   Vaping status: Never Used  Substance and Sexual Activity   Alcohol use: Never   Drug use: Never   Sexual activity: Not on file  Other Topics Concern   Not  on file  Social History Narrative   ** Merged History Encounter **       Widowed Right Handed Drinks 9 cups of caffeine   Social Drivers of Corporate investment banker Strain: Low Risk  (11/19/2023)   Overall Financial Resource Strain (CARDIA)    Difficulty of Paying Living Expenses: Not hard at all  Food Insecurity: No Food Insecurity (11/19/2023)   Hunger Vital Sign    Worried About Running Out of Food in the Last Year: Never true    Ran Out of Food in the Last Year: Never true  Transportation Needs: No Transportation Needs (11/19/2023)   PRAPARE - Administrator, Civil Service (Medical): No    Lack of Transportation (Non-Medical): No  Physical Activity: Sufficiently Active (11/19/2023)   Exercise Vital Sign    Days of Exercise per Week: 7 days    Minutes of Exercise per Session: 60 min  Stress: No Stress Concern Present (11/19/2023)   [employer] of Occupational Health - Occupational Stress Questionnaire    Feeling of Stress: Only a little  Social Connections: Moderately Isolated (11/19/2023)   Social Connection and Isolation Panel    Frequency of Communication with Friends and Family: Never    Frequency of Social Gatherings with Friends and Family: More than three times a week    Attends Religious Services: More than 4 times per year    Active Member of [employer] or Organizations: No    Attends Banker Meetings: Never    Marital Status: Widowed    Tobacco Counseling Ready to quit: No Counseling given: Not Answered Tobacco comments: Patient smoked 3 packs/day at the most, started smoking at age 68,    Clinical Intake:  Pre-visit preparation completed: Yes  Pain : No/denies pain     BMI - recorded: 32.1 Nutritional Status: BMI > 30  Obese Nutritional Risks: None Diabetes: No  Lab Results  Component Value Date   HGBA1C 5.1 07/30/2022   HGBA1C 5.1 07/23/2021   HGBA1C 5.1 06/26/2020     How often do you need to have someone help you  when you read instructions, pamphlets, or other written materials from your doctor or pharmacy?: 1 - Never  Interpreter Needed?: No  Information entered by :: R. Chakia Counts LPN   Activities of Daily Living     11/19/2023    8:58 AM  In your present state of health, do you have any difficulty performing the following activities:  Hearing? 0  Vision? 0  Difficulty concentrating or making decisions? 0  Walking or climbing stairs? 0  Dressing or bathing? 0  Doing errands, shopping? 0  Preparing Food and eating ? N  Using the Toilet? N  In the past six months, have you accidently leaked urine? N  Do you have problems with loss of bowel control? N  Managing your Medications? N  Managing your Finances? N  Housekeeping or managing your Housekeeping? N  Patient Care Team: Gretel App, NP as PCP - General (Nurse Practitioner) Geronimo Manuelita SAUNDERS, [facility] (Pharmacist)  I have updated your Care Teams any recent Medical Services you may have received from other providers in the past year.     Assessment:   This is a routine wellness examination for Deshone.  Hearing/Vision screen Hearing Screening - Comments:: No issues Vision Screening - Comments:: readers   Goals Addressed             This Visit's Progress    Patient Stated       Wants to stay active       Depression Screen     11/19/2023    9:06 AM 01/31/2023    9:35 AM 11/15/2022    9:12 AM 07/30/2022   10:37 AM 01/28/2022    9:57 AM 07/30/2021    8:39 AM 07/25/2021   10:40 AM  PHQ 2/9 Scores  PHQ - 2 Score 0 0 0 0 0 0 0  PHQ- 9 Score 1 0 0 0       Fall Risk     11/19/2023    9:02 AM 01/31/2023    9:35 AM 11/15/2022    9:09 AM 07/30/2022   10:37 AM 01/28/2022    9:57 AM  Fall Risk   Falls in the past year? 0 0 0 0 0  Number falls in past yr: 0 0 0 0 0  Injury with Fall? 0 0 0 0 0  Risk for fall due to : No Fall Risks No Fall Risks No Fall Risks No Fall Risks No Fall Risks  Follow up Falls evaluation completed;Falls  prevention discussed Falls evaluation completed Falls prevention discussed;Falls evaluation completed Falls evaluation completed Falls evaluation completed      Data saved with a previous flowsheet row definition    MEDICARE RISK AT HOME:  Medicare Risk at Home Any stairs in or around the home?: Yes If so, are there any without handrails?: No Home free of loose throw rugs in walkways, pet beds, electrical cords, etc?: Yes Adequate lighting in your home to reduce risk of falls?: Yes Life alert?: Yes Use of a cane, walker or w/c?: No Grab bars in the bathroom?: Yes Shower chair or bench in shower?: No Elevated toilet seat or a handicapped toilet?: No  TIMED UP AND GO:  Was the test performed?  No  Cognitive Function: 6CIT completed        11/19/2023    9:14 AM 11/15/2022    9:18 AM 07/30/2021    9:03 AM 07/26/2020    8:43 AM  6CIT Screen  What Year? 0 points 0 points 0 points 0 points  What month? 0 points 0 points 0 points 0 points  What time? 0 points 0 points 0 points 0 points  Count back from 20 0 points 0 points 0 points 0 points  Months in reverse 2 points 0 points 0 points 0 points  Repeat phrase 4 points 6 points 0 points   Total Score 6 points 6 points 0 points     Immunizations  There is no immunization history on file for this patient.  Screening Tests Health Maintenance  Topic Date Due   DTaP/Tdap/Td (1 - Tdap) Never done   Influenza Vaccine  Never done   Medicare Annual Wellness (AWV)  11/15/2023   Colonoscopy  07/08/2024 (Originally 07/09/1999)   HPV VACCINES  Aged Out   Meningococcal B Vaccine  Aged Out   Pneumococcal Vaccine: 50+  Years  Discontinued   COVID-19 Vaccine  Discontinued   Hepatitis C Screening  Discontinued   Zoster Vaccines- Shingrix  Discontinued    Health Maintenance Items Addressed:Patient declines all vaccines and colonoscopy  Additional Screening:  Vision Screening: Recommended annual ophthalmology exams for early detection of  glaucoma and other disorders of the eye. Is the patient up to date with their annual eye exam?  No  Who is the provider or what is the name of the office in which the patient attends annual eye exams? Patient stated that he sees someone in [place] and will call and schedule an appointment.   Dental Screening: Recommended annual dental exams for proper oral hygiene  Community Resource Referral / Chronic Care Management: CRR required this visit?  No   CCM required this visit?  No   Plan:    I have personally reviewed and noted the following in the patient's chart:   Medical and social history Use of alcohol, tobacco or illicit drugs  Current medications and supplements including opioid prescriptions. Patient is not currently taking opioid prescriptions. Functional ability and status Nutritional status Physical activity Advanced directives List of other physicians Hospitalizations, surgeries, and ER visits in previous 12 months Vitals Screenings to include cognitive, depression, and falls Referrals and appointments  In addition, I have reviewed and discussed with patient certain preventive protocols, quality metrics, and best practice recommendations. A written personalized care plan for preventive services as well as general preventive health recommendations were provided to patient.   Angeline Fredericks, LPN   0/[id number]   After Visit Summary: (MyChart) Due to this being a telephonic visit, the after visit summary with patients personalized plan was offered to patient via MyChart   Notes: Nothing significant to report at this time.

## 2023-11-19 NOTE — Patient Instructions (Signed)
 Mr. Barg,  Thank you for taking the time for your Medicare Wellness Visit. I appreciate your continued commitment to your health goals. Please review the care plan we discussed, and feel free to reach out if I can assist you further.  Medicare recommends these wellness visits once per year to help you and your care team stay ahead of potential health issues. These visits are designed to focus on prevention, allowing your provider to concentrate on managing your acute and chronic conditions during your regular appointments.  Please note that Annual Wellness Visits do not include a physical exam. Some assessments may be limited, especially if the visit was conducted virtually. If needed, we may recommend a separate in-person follow-up with your provider.  Ongoing Care Seeing your primary care provider every 3 to 6 months helps us  monitor your health and provide consistent, personalized care.  Consider updating your vaccines.  Referrals If a referral was made during today's visit and you haven't received any updates within two weeks, please contact the referred provider directly to check on the status.  Recommended Screenings:  Health Maintenance  Topic Date Due   DTaP/Tdap/Td vaccine (1 - Tdap) Never done   Flu Shot  Never done   Colon Cancer Screening  07/08/2024*   Medicare Annual Wellness Visit  11/18/2024   HPV Vaccine  Aged Out   Meningitis B Vaccine  Aged Out   Pneumococcal Vaccine for age over 67  Discontinued   COVID-19 Vaccine  Discontinued   Hepatitis C Screening  Discontinued   Zoster (Shingles) Vaccine  Discontinued  *Topic was postponed. The date shown is not the original due date.       11/19/2023    9:13 AM  Advanced Directives  Does Patient Have a Medical Advance Directive? No  Would patient like information on creating a medical advance directive? No - Patient declined   Advance Care Planning is important because it: Ensures you receive medical care that aligns  with your values, goals, and preferences. Provides guidance to your family and loved ones, reducing the emotional burden of decision-making during critical moments.  Vision: Annual vision screenings are recommended for early detection of glaucoma, cataracts, and diabetic retinopathy. These exams can also reveal signs of chronic conditions such as diabetes and high blood pressure.  Dental: Annual dental screenings help detect early signs of oral cancer, gum disease, and other conditions linked to overall health, including heart disease and diabetes.  Please see the attached documents for additional preventive care recommendations.

## 2024-01-08 ENCOUNTER — Ambulatory Visit (INDEPENDENT_AMBULATORY_CARE_PROVIDER_SITE_OTHER): Admitting: Nurse Practitioner

## 2024-01-08 ENCOUNTER — Encounter: Payer: Self-pay | Admitting: Nurse Practitioner

## 2024-01-08 VITALS — BP 156/72 | HR 75 | Temp 97.7°F | Ht 74.0 in | Wt 252.8 lb

## 2024-01-08 DIAGNOSIS — F17219 Nicotine dependence, cigarettes, with unspecified nicotine-induced disorders: Secondary | ICD-10-CM | POA: Diagnosis not present

## 2024-01-08 DIAGNOSIS — Z8673 Personal history of transient ischemic attack (TIA), and cerebral infarction without residual deficits: Secondary | ICD-10-CM

## 2024-01-08 DIAGNOSIS — E7800 Pure hypercholesterolemia, unspecified: Secondary | ICD-10-CM | POA: Diagnosis not present

## 2024-01-08 DIAGNOSIS — F439 Reaction to severe stress, unspecified: Secondary | ICD-10-CM | POA: Diagnosis not present

## 2024-01-08 MED ORDER — VARENICLINE TARTRATE (STARTER) 0.5 MG X 11 & 1 MG X 42 PO TBPK: ORAL_TABLET | ORAL | 0 refills | Status: AC

## 2024-01-08 MED ORDER — VARENICLINE TARTRATE 1 MG PO TABS: 1.0000 mg | ORAL_TABLET | Freq: Two times a day (BID) | ORAL | 2 refills | Status: AC

## 2024-01-08 MED ORDER — REPATHA SURECLICK 140 MG/ML ~~LOC~~ SOAJ: 140.0000 mg | SUBCUTANEOUS | 3 refills | Status: AC

## 2024-01-08 NOTE — Assessment & Plan Note (Signed)
 Resumed smoking due to stress and family issues but has a strong desire to quit. Previous cessation was successful with Chantix . Tolerated medication well. New starter titration sent to pharmacy along with continuation. Encouraged smoking cessation.

## 2024-01-08 NOTE — Assessment & Plan Note (Addendum)
 Well-controlled with Repatha . Reports cold-like symptoms as side effect. Continue Repatha  biweekly.

## 2024-01-08 NOTE — Progress Notes (Signed)
 Leron Glance, NP-C Phone: [phone]  Juan Logan is a 68 y.o. male who presents today for follow up.   Discussed the use of AI scribe software for clinical note transcription with the patient, who gave verbal consent to proceed.  History of Present Illness   Juan Logan is a 68 year old male with a history of stroke and hyperlipidemia who presents for smoking cessation and medication review.  He has a Ferner history of smoking, beginning at age [age], with intermittent periods of cessation. He successfully quit for 27 years but resumed smoking due to family stressors and his wife's illness. Currently, he smokes about ten cigarettes a day. He has previously used Chantix  to aid in smoking cessation and wants to quit again, acknowledging that smoking helps manage stress but negatively impacts his cognitive function.  He is on Repatha , administered biweekly, which he tolerates well except for experiencing cold-like symptoms. His cholesterol levels have improved significantly with this treatment. He has a history of a stroke, after which he experienced speech and cognitive difficulties, but reports improvement over time. He mentions a previous adverse reaction to statins, which led to suicidal ideation, making him hesitant to try medications that alter brain chemistry.  His social history is marked by significant family stress, including financial burdens and the loss of close family members. He has experienced the deaths of his daughter, wife, father, and mother, contributing to his current stress levels. He lives with family members who do not contribute financially, adding to his stress.  No shortness of breath reported. He mentions limited physical activity due to stress.      Social History   Tobacco Use  Smoking Status Every Day   Current packs/day: 0.50   Average packs/day: 0.5 packs/day for 11.8 years (5.9 ttl pk-yrs)   Types: Cigarettes   Start date: 2014  Smokeless Tobacco Never   Tobacco Comments   Patient smoked 3 packs/day at the most, started smoking at age 79,    Current Outpatient Medications on File Prior to Visit  Medication Sig Dispense Refill   aspirin  EC 81 MG EC tablet Take 1 tablet (81 mg total) by mouth daily. Swallow whole. 30 tablet 11   No current facility-administered medications on file prior to visit.     ROS see history of present illness  Objective  Physical Exam Vitals:   01/08/24 1011 01/08/24 1039  BP: (!) 150/76 (!) 156/72  Pulse: 75   Temp: 97.7 F (36.5 C)   SpO2: 96%     BP Readings from Last 3 Encounters:  01/08/24 (!) 156/72  07/09/23 (!) 140/76  01/31/23 122/72   Wt Readings from Last 3 Encounters:  01/08/24 252 lb 12.8 oz (114.7 kg)  11/19/23 250 lb (113.4 kg)  07/09/23 250 lb (113.4 kg)    Physical Exam Constitutional:      General: He is not in acute distress.    Appearance: Normal appearance.  HENT:     Head: Normocephalic.  Cardiovascular:     Rate and Rhythm: Normal rate and regular rhythm.     Heart sounds: Normal heart sounds.  Pulmonary:     Effort: Pulmonary effort is normal.     Breath sounds: Normal breath sounds.  Skin:    General: Skin is warm and dry.  Neurological:     General: No focal deficit present.     Mental Status: He is alert.  Psychiatric:        Mood and Affect: Mood normal.  Behavior: Behavior normal.      Assessment/Plan: Please see individual problem list.  Nicotine dependence, cigarettes, w unsp disorders Assessment & Plan: Resumed smoking due to stress and family issues but has a strong desire to quit. Previous cessation was successful with Chantix . Tolerated medication well. New starter titration sent to pharmacy along with continuation. Encouraged smoking cessation.   Orders: -     Varenicline  Tartrate (Starter); Take as directed.  Dispense: 53 each; Refill: 0 -     Varenicline  Tartrate; Take 1 tablet (1 mg total) by mouth 2 (two) times daily. After  completing starting month.  Dispense: 60 tablet; Refill: 2  Stress Assessment & Plan: Increased stress due to family issues and financial burdens. Not interested in medications at this time. We will continue to monitor. Encouraged to contact if worsening symptoms or suicidal thoughts occur.    Pure hypercholesterolemia Assessment & Plan: Well-controlled with Repatha . Reports cold-like symptoms as side effect. Continue Repatha  biweekly.   Orders: -     Repatha  SureClick; Inject 140 mg into the skin every 14 (fourteen) days.  Dispense: 6 mL; Refill: 3  History of CVA (cerebrovascular accident) -     Repatha  SureClick; Inject 140 mg into the skin every 14 (fourteen) days.  Dispense: 6 mL; Refill: 3     Return in about 6 months (around 07/08/2024) for Follow up.   Leron Glance, NP-C [facility]

## 2024-01-08 NOTE — Assessment & Plan Note (Signed)
 Increased stress due to family issues and financial burdens. Not interested in medications at this time. We will continue to monitor. Encouraged to contact if worsening symptoms or suicidal thoughts occur.

## 2024-02-16 ENCOUNTER — Encounter: Payer: Self-pay | Admitting: Pharmacist

## 2024-02-16 NOTE — Progress Notes (Signed)
 [employer] - Re-enrollment   Medication(s): All cholesterol medications (Repatha )   Currently Enrolled: Expired 02/16/24   Application Status:  Approved for re-enrollment    [employer] ID: [id number] Fund: Hypercholesterolemia - Medicare Access Assistance Type: Co-pay Start Date: 02/17/2024 End Date: 02/15/2025               Rx Card: Card No.  [id number] RX BIN:  [id number] PCN:  [id number] Group:  [id number]   No recent MyChart use.  Called patient's pharmacy [facility]) and provided them with updated grant information.    Manuelita FABIENE Kobs, PharmD Clinical Pharmacist [facility] [phone]

## 2024-07-08 ENCOUNTER — Ambulatory Visit: Admitting: Nurse Practitioner

## 2024-11-23 ENCOUNTER — Ambulatory Visit
